# Patient Record
Sex: Female | Born: 1960 | Race: Black or African American | Hispanic: No | Marital: Single | Smoking: Never smoker
Health system: Southern US, Community
[De-identification: ages and names within clinical notes are randomized; demographics above are authoritative.]

## PROBLEM LIST (undated history)

## (undated) DIAGNOSIS — E079 Disorder of thyroid, unspecified: Secondary | ICD-10-CM

## (undated) DIAGNOSIS — E119 Type 2 diabetes mellitus without complications: Secondary | ICD-10-CM

## (undated) DIAGNOSIS — J40 Bronchitis, not specified as acute or chronic: Secondary | ICD-10-CM

## (undated) DIAGNOSIS — J349 Unspecified disorder of nose and nasal sinuses: Secondary | ICD-10-CM

## (undated) DIAGNOSIS — J45909 Unspecified asthma, uncomplicated: Secondary | ICD-10-CM

## (undated) DIAGNOSIS — I10 Essential (primary) hypertension: Secondary | ICD-10-CM

## (undated) HISTORY — PX: ABDOMINAL SURGERY: SHX537

## (undated) HISTORY — DX: Type 2 diabetes mellitus without complications: E11.9

## (undated) HISTORY — PX: ABDOMINAL HYSTERECTOMY: SHX81

## (undated) HISTORY — PX: HERNIA REPAIR: SHX51

---

## 2001-04-04 ENCOUNTER — Ambulatory Visit (HOSPITAL_COMMUNITY): Admission: RE | Admit: 2001-04-04 | Discharge: 2001-04-04 | Payer: Self-pay | Admitting: Family Medicine

## 2001-04-04 ENCOUNTER — Encounter: Payer: Self-pay | Admitting: Family Medicine

## 2002-10-12 ENCOUNTER — Emergency Department (HOSPITAL_COMMUNITY): Admission: EM | Admit: 2002-10-12 | Discharge: 2002-10-12 | Payer: Self-pay | Admitting: *Deleted

## 2003-12-20 ENCOUNTER — Emergency Department (HOSPITAL_COMMUNITY): Admission: EM | Admit: 2003-12-20 | Discharge: 2003-12-20 | Payer: Self-pay | Admitting: *Deleted

## 2003-12-23 ENCOUNTER — Emergency Department (HOSPITAL_COMMUNITY): Admission: EM | Admit: 2003-12-23 | Discharge: 2003-12-23 | Payer: Self-pay | Admitting: Emergency Medicine

## 2004-02-22 ENCOUNTER — Emergency Department (HOSPITAL_COMMUNITY): Admission: EM | Admit: 2004-02-22 | Discharge: 2004-02-22 | Payer: Self-pay | Admitting: Emergency Medicine

## 2004-04-06 ENCOUNTER — Emergency Department (HOSPITAL_COMMUNITY): Admission: EM | Admit: 2004-04-06 | Discharge: 2004-04-06 | Payer: Self-pay | Admitting: Emergency Medicine

## 2004-06-25 ENCOUNTER — Emergency Department (HOSPITAL_COMMUNITY): Admission: EM | Admit: 2004-06-25 | Discharge: 2004-06-25 | Payer: Self-pay | Admitting: Emergency Medicine

## 2006-01-31 ENCOUNTER — Encounter (HOSPITAL_COMMUNITY): Admission: RE | Admit: 2006-01-31 | Discharge: 2006-02-11 | Payer: Self-pay | Admitting: Preventative Medicine

## 2006-03-27 ENCOUNTER — Emergency Department (HOSPITAL_COMMUNITY): Admission: EM | Admit: 2006-03-27 | Discharge: 2006-03-27 | Payer: Self-pay | Admitting: Emergency Medicine

## 2006-06-27 ENCOUNTER — Encounter (HOSPITAL_COMMUNITY): Admission: RE | Admit: 2006-06-27 | Discharge: 2006-07-27 | Payer: Self-pay | Admitting: Orthopaedic Surgery

## 2006-11-06 ENCOUNTER — Emergency Department (HOSPITAL_COMMUNITY): Admission: EM | Admit: 2006-11-06 | Discharge: 2006-11-06 | Payer: Self-pay | Admitting: Emergency Medicine

## 2008-01-26 ENCOUNTER — Ambulatory Visit (HOSPITAL_COMMUNITY): Admission: RE | Admit: 2008-01-26 | Discharge: 2008-01-26 | Payer: Self-pay | Admitting: Family Medicine

## 2008-06-30 ENCOUNTER — Emergency Department (HOSPITAL_COMMUNITY): Admission: EM | Admit: 2008-06-30 | Discharge: 2008-06-30 | Payer: Self-pay | Admitting: Emergency Medicine

## 2008-07-22 ENCOUNTER — Inpatient Hospital Stay (HOSPITAL_COMMUNITY): Admission: EM | Admit: 2008-07-22 | Discharge: 2008-07-23 | Payer: Self-pay | Admitting: Emergency Medicine

## 2008-07-22 ENCOUNTER — Ambulatory Visit: Payer: Self-pay | Admitting: Internal Medicine

## 2008-07-23 ENCOUNTER — Ambulatory Visit: Payer: Self-pay | Admitting: Internal Medicine

## 2008-08-09 ENCOUNTER — Encounter: Payer: Self-pay | Admitting: Internal Medicine

## 2008-08-09 ENCOUNTER — Ambulatory Visit (HOSPITAL_COMMUNITY): Admission: RE | Admit: 2008-08-09 | Discharge: 2008-08-09 | Payer: Self-pay | Admitting: Obstetrics and Gynecology

## 2008-11-16 ENCOUNTER — Inpatient Hospital Stay (HOSPITAL_COMMUNITY): Admission: RE | Admit: 2008-11-16 | Discharge: 2008-11-18 | Payer: Self-pay | Admitting: Obstetrics and Gynecology

## 2008-11-16 ENCOUNTER — Encounter: Payer: Self-pay | Admitting: Obstetrics and Gynecology

## 2010-05-18 LAB — CBC
HCT: 38.8 % (ref 36.0–46.0)
Hemoglobin: 11.9 g/dL — ABNORMAL LOW (ref 12.0–15.0)
Platelets: 298 10*3/uL (ref 150–400)
RBC: 3.85 MIL/uL — ABNORMAL LOW (ref 3.87–5.11)
RBC: 4.32 MIL/uL (ref 3.87–5.11)
WBC: 4.5 10*3/uL (ref 4.0–10.5)
WBC: 7.9 10*3/uL (ref 4.0–10.5)

## 2010-05-18 LAB — COMPREHENSIVE METABOLIC PANEL
AST: 36 U/L (ref 0–37)
Albumin: 4 g/dL (ref 3.5–5.2)
Alkaline Phosphatase: 53 U/L (ref 39–117)
BUN: 12 mg/dL (ref 6–23)
CO2: 31 mEq/L (ref 19–32)
Chloride: 101 mEq/L (ref 96–112)
GFR calc Af Amer: >60 mL/min (ref >60)
GFR calc non Af Amer: 59 mL/min — ABNORMAL LOW (ref >60)
Potassium: 3 mEq/L — ABNORMAL LOW (ref 3.5–5.1)
Total Bilirubin: 0.5 mg/dL (ref 0.3–1.2)

## 2010-05-18 LAB — BASIC METABOLIC PANEL
Calcium: 7.9 mg/dL — ABNORMAL LOW (ref 8.4–10.5)
GFR calc Af Amer: >60 mL/min (ref >60)
GFR calc non Af Amer: >60 mL/min (ref >60)
Sodium: 135 mEq/L (ref 135–145)

## 2010-05-18 LAB — DIFFERENTIAL
Basophils Absolute: 0 10*3/uL (ref 0.0–0.1)
Lymphocytes Relative: 12 % (ref 12–46)
Lymphs Abs: 0.9 10*3/uL (ref 0.7–4.0)
Monocytes Absolute: 0.4 10*3/uL (ref 0.1–1.0)
Monocytes Relative: 5 % (ref 3–12)
Neutro Abs: 6.5 10*3/uL (ref 1.7–7.7)

## 2010-05-18 LAB — POCT I-STAT 4, (NA,K, GLUC, HGB,HCT)
Glucose, Bld: 106 mg/dL — ABNORMAL HIGH (ref 70–99)
HCT: 47 % — ABNORMAL HIGH (ref 36.0–46.0)

## 2010-05-22 LAB — COMPREHENSIVE METABOLIC PANEL
ALT: 17 U/L (ref 0–35)
AST: 20 U/L (ref 0–37)
CO2: 31 mEq/L (ref 19–32)
Chloride: 100 mEq/L (ref 96–112)
Creatinine, Ser: 0.83 mg/dL (ref 0.4–1.2)
GFR calc Af Amer: >60 mL/min (ref >60)
GFR calc non Af Amer: >60 mL/min (ref >60)
Sodium: 138 mEq/L (ref 135–145)
Total Bilirubin: 0.3 mg/dL (ref 0.3–1.2)

## 2010-05-22 LAB — URINALYSIS, ROUTINE W REFLEX MICROSCOPIC
Bilirubin Urine: NEGATIVE
Nitrite: NEGATIVE
Specific Gravity, Urine: 1.03 (ref 1.005–1.030)
pH: 6 (ref 5.0–8.0)

## 2010-05-22 LAB — DIFFERENTIAL
Basophils Absolute: 0 10*3/uL (ref 0.0–0.1)
Basophils Absolute: 0 10*3/uL (ref 0.0–0.1)
Basophils Relative: 0 % (ref 0–1)
Basophils Relative: 0 % (ref 0–1)
Eosinophils Absolute: 0.1 10*3/uL (ref 0.0–0.7)
Eosinophils Relative: 2 % (ref 0–5)
Eosinophils Relative: 2 % (ref 0–5)
Monocytes Absolute: 0.3 10*3/uL (ref 0.1–1.0)

## 2010-05-22 LAB — HEMOGLOBIN AND HEMATOCRIT, BLOOD: Hemoglobin: 8.4 g/dL — ABNORMAL LOW (ref 12.0–15.0)

## 2010-05-22 LAB — BASIC METABOLIC PANEL
BUN: 9 mg/dL (ref 6–23)
Calcium: 8.3 mg/dL — ABNORMAL LOW (ref 8.4–10.5)
GFR calc non Af Amer: >60 mL/min (ref >60)
Glucose, Bld: 113 mg/dL — ABNORMAL HIGH (ref 70–99)

## 2010-05-22 LAB — CBC
HCT: 24.6 % — ABNORMAL LOW (ref 36.0–46.0)
MCHC: 32.8 g/dL (ref 30.0–36.0)
MCV: 78.6 fL (ref 78.0–100.0)
Platelets: 332 10*3/uL (ref 150–400)
RBC: 3.47 MIL/uL — ABNORMAL LOW (ref 3.87–5.11)
RDW: 16 % — ABNORMAL HIGH (ref 11.5–15.5)
WBC: 7 10*3/uL (ref 4.0–10.5)

## 2010-05-22 LAB — CROSSMATCH

## 2010-05-22 LAB — LIPASE, BLOOD: Lipase: 19 U/L (ref 11–59)

## 2010-06-27 NOTE — H&P (Signed)
Hannah Potter, Hannah Potter             ACCOUNT NO.:  [id number]   MEDICAL RECORD NO.:  [id number]          PATIENT TYPE:  INP   LOCATION:  A309                          FACILITY:  [facility]   PHYSICIAN:  Angus G. Renard Matter, MD   DATE OF BIRTH:  [birth year]   DATE OF ADMISSION:  07/22/2008  DATE OF DISCHARGE:  LH                              HISTORY & PHYSICAL   A 50 year old African American female, patient of Dr. Sudie Bailey,  presented to the emergency department with chief complaint being lower  abdominal pain, intermittent vaginal bleeding for the past month and  some occasional rectal bleeding.  The patient today stated she had some  nausea and weakness, tremor of the upper extremities, complaint of lower  abdominal pain which was sharp, intermittent, moderately severe.  The  patient had no diarrhea.  The patient was examined by ED physician and  was noted that she had some vaginal bleeding and rectal exam was  positive for blood.  The patient had subsequently a CT of the abdomen  which showed no acute or significant abnormalities.  Umbilical hernia  was noted containing fat, numerous uterine fibroids present.  No adnexal  masses.  The patient was subsequently admitted, was noted to have a low  hemoglobin as well.  CBC showed a hemoglobin 8.9, hematocrit 27.3,  potassium low at 3.2.  Urine drug screen essentially negative.   SOCIAL HISTORY:  The patient does not smoke, drink alcohol, or use  drugs.   PAST SURGICAL HISTORY:  The patient has prior history of knee surgery.   PAST MEDICAL HISTORY:  1. Asthma.  2. Hypertension.  3. Hypothyroidism.   ALLERGIES:  No known allergies.   REVIEW OF SYSTEMS:  HEENT:  Negative.  CARDIOPULMONARY:  No cough,  hemoptysis, or dyspnea.  GI:  Bouts of nausea.  No diarrhea.  Lower  abdominal pain.  GYN:  The patient has had history of prolonged menses  in the fall and again she bled throughout the month of April.   PHYSICAL EXAMINATION:  GENERAL:  Obese,  alert female.  VITAL SIGNS:  Blood pressure __________, pulse __________, respirations  __________ and temperature __________.  HEENT:  Eyes, PERRLA.  TMs negative.  Oropharynx benign.  NECK:  Supple.  No JVD or thyroid abnormalities.  HEART:  Regular rhythm.  No murmurs.  LUNGS:  Clear to P and A.  ABDOMEN:  No palpable organs or masses.  No organomegaly.  EXTREMITIES:  Free of edema.  GENITOURINARY:  Menstrual bleeding noted by ED physician.  Stool was  positive for blood noted by ED physician.   ASSESSMENT:  The patient admitted with abdominal pain, anemia, rectal  and vaginal bleeding, hypokalemia.   PLAN:  To continue to monitor vital signs.  Continue to monitor  hemoglobin/hematocrit or transfuse as needed.  We will replace potassium  and have GYN and possible GI consult.      Angus G. Renard Matter, MD  Electronically Signed     AGM/MEDQ  D:  07/22/2008  T:  07/23/2008  Job:  [id number]

## 2010-06-27 NOTE — Consult Note (Signed)
NAMERONNY, RUDDELL             ACCOUNT NO.:  [id number]   MEDICAL RECORD NO.:  [id number]          PATIENT TYPE:  INP   LOCATION:  A309                          FACILITY:  [facility]   PHYSICIAN:  R. Roetta Sessions, M.D. DATE OF BIRTH:  31-Dec-1960   DATE OF CONSULTATION:  07/22/2008  DATE OF DISCHARGE:                                 CONSULTATION   REASON FOR CONSULTATION:  Hemoccult positive stool, profound microcytic  anemia.   HISTORY OF PRESENT ILLNESS:  Hannah Potter is a pleasant 50-  year-old [place] American female from [place] who came to emergency  room today from work when she developed diffuse abdominal pain and some  diaphoresis.  She presented to emergency department where she was  evaluated and found to be profoundly anemic with a hemoglobin and  hematocrit 8.9 and 27.3, MCV 78.6, platelet count was 313,000.  Ms.  Hannah Potter underwent a battery of studies in the emergency department  including a comprehensive metabolic profile which revealed a potassium  of 3.2, otherwise looked good.  White count was 7000.  She was found to  be occult blood positive on digital rectal exam in emergency room.  She  denies melena, hematochezia.  She does note heavy menstrual flow which  has been ongoing now for 6 weeks.  CT scan of the abdomen and pelvis on  admission demonstrated uterine fibroids and a small umbilical hernia.  Otherwise there was no evidence of colitis or other abnormality to  explain abdominal pain.  She received some Dilaudid and her abdominal  pain has since resolved.  Also noted normal serum lipase on admission.   [name] tells me she has never had abdominal pain like this before  and she denies constipation, diarrhea, melena or hematochezia.  She  denies any upper GI tract symptoms such odynophagia, dysphagia, early  sign of reflux symptoms or nausea or vomiting.   Positive family history is in her father who she believes had a colonic  resection for  colon cancer in his 50s and he had close surveillance for  several years as she describes he is alive and doing well in his 50s  now.  Otherwise there is no history of chronic GI or liver illness.   PAST MEDICAL HISTORY:  Impaired glucose tolerance, hypertension,  hypothyroidism.   MEDICATIONS ON ADMISSION:  1. Synthroid 150 mcg daily.  2. Hydrochlorothiazide 25 mg daily.  3. Albuterol and Flovent p.r.n.   ALLERGIES:  NO KNOWN DRUG ALLERGIES   FAMILY HISTORY:  As outlined above.   SOCIAL HISTORY:  The patient is single.  She has two children in good  health.  She does not use tobacco, alcohol or illicit drugs.  She works  for [employer], she has worked there for 15 years.   REVIEW OF SYSTEMS:  As in history of present illness.  No fever, chills,  night sweats.  No change in weight recently.   PHYSICAL EXAMINATION:  GENERAL:  A very pleasant lady resting  comfortably accompanied by a friend in room 309.  VITAL SIGNS:  Temperature 97.6, pulse 64, BP 109/90, O2  saturation 97%  on room air.  SKIN:  Warm and dry.  HEENT: Exam no scleral icterus.  Conjunctivae pink.  Somewhat pale.  CHEST:  Lungs are clear to auscultation.  HEART:  Regular rate and rhythm without murmur, gallop or rub.  ABDOMEN:  Obese, positive bowel sounds, soft, entirely nontender without  appreciable mass or organomegaly.  EXTREMITIES:  No edema.  RECTAL:  Exam deferred until time of colonoscopy.   LABORATORY DATA:  H and H at 1950 is 8.4 and 25.9.  Sodium 138,  potassium 3.2, chloride 100, CO2 31, glucose 153, BUN 10, creatinine  0.8.  Total bilirubin 0.3, alkaline phos 57, AST 20, ALT 79, BUN 3.5.   IMPRESSION:  Hannah Potter is a very pleasant obese 50-year lady  admitted to hospital with profound anemia and Hemoccult positive stool.  However, she has had her menstrual flow now she reports for six weeks.   Diffuse crampy abdominal pain prompted her ED visit today.  That symptom  was apparently  self limiting and abolished with a single dose of  Dilaudid.  It is reassuring to note her labs above and essentially  negative CT findings aside from uterine fibroids.   Hemoccult positive stool may be a true positive or could be a spurious  positive with contamination from menstrual bleeding.  I suspect that if  she has had a menstrual flow for 6 weeks this may be more of a cause of  her low hemoglobin rather than anything else.  However, she has been  documented to be Hemoccult positive and appears to have a positive  female history of colon cancer in first-degree relative at a young age.   Etiology of her abdominal pain which was self-limiting is not clear to  me, however is reassuring or negative laboratory workup as well as  essentially negative CT findings.  She really does not have any chronic  GI symptoms and clinically she has not had any bleeding.   RECOMMENDATIONS:  1. While she is here I feel it would be in her best interest to go      ahead and perform a diagnostic colonoscopy in the near future.      Risks, benefits, alternatives and limitations have been reviewed.      Her questions were answered.  I told Hannah Potter if colonoscopy was      negative would go ahead and do an EGD complete her inpatient GI      evaluation.  She was also agreeable with this approach as well.  I      will go ahead and give her prophylactic acid suppression therapy      for the time being.  2. I fully agree with in a GYN evaluation.  3. Will go ahead and treat her hypokalemia with oral therapy.   I discussed findings and recommendations with Dr. Butch Penny at the  nurses' station this evening.   I would like to thank Dr. Renard Matter for allowing me to see this nice lady  today in consultation.  Will plan endoscopic evaluation for July 23, 2008.      Hannah Potter, M.D.  Electronically Signed     RMR/MEDQ  D:  07/22/2008  T:  07/23/2008  Job:  [id number]   cc:   Dr. Gareth Morgan

## 2010-06-27 NOTE — Op Note (Signed)
Hannah Potter, Hannah Potter             ACCOUNT NO.:  [id number]   MEDICAL RECORD NO.:  [id number]          PATIENT TYPE:  INP   LOCATION:  A309                          FACILITY:  [facility]   PHYSICIAN:  R. Roetta Sessions, M.D. DATE OF BIRTH:  December 11, 1960   DATE OF PROCEDURE:  07/23/2008  DATE OF DISCHARGE:                               OPERATIVE REPORT   PROCEDURES PERFORMED:  Diagnostic colonoscopy, followed by  esophagogastroduodenoscopy.   INDICATION FOR PROCEDURE:  A 50 year old lady admitted to the hospital  with profound microcytic anemia and Hemoccult-positive stool.  She has  had heavy menstrual flow for the past 6 weeks.  Positive family history  of colon cancer in her father, who underwent a resection in his 30s.  Colonoscopy is now being done to further evaluate her symptoms.  EGD to  follow if colonoscopy negative.  This approach has been discussed with  the patient at length yesterday and again at the bedside.   PROCEDURE NOTE:  O2 saturation, blood pressure, pulse, and respiration  were monitored throughout the entire procedure.  Conscious sedation:  Versed 4 mg IV, Demerol 75 mg IV in divided doses.  Instrument:  Pentax  video chip system.   Findings:  Digital rectal exam revealed no abnormalities.  Endoscopic  findings:  Prep was good.  Colon:  Colonic mucosa was surveyed from the  rectosigmoid junction through the left, transverse, right colon, to the  appendiceal orifice, ileocecal valve and cecum.  These structures were  well-seen and photographed for the record.  From this level the scope  was slowly withdrawn and all previously-mentioned mucosal surfaces were  again seen.  The patient had a long, redundant colon.  Otherwise, the  mucosa appeared normal.  The scope was pulled down in the rectum, where  a thorough examination of the rectal mucosa including retroflexed view  of the anal verge demonstrated no abnormalities.  Cecal withdrawal time  8 minutes.   The patient  was prepared for EGD with Cetacaine spray for topical  oropharyngeal anesthesia.  Findings:  Examination of the tubular  esophagus revealed no mucosal abnormalities, EG junction easily  traversed.  Stomach:  The gastric cavity was emptied and insufflated  well with air.  A thorough examination of the gastric mucosa including  retroflexion in the proximal stomach-esophagogastric junction  demonstrated only a small hiatal hernia.  The pylorus was patent and  easily traversed.  Examination of the bulb and second portion revealed  no abnormalities.   Therapeutic/diagnostic maneuvers performed:  None.   The patient tolerated both procedures well, was reacted in endoscopy.   IMPRESSION:  1. Colonoscopy:  Normal rectum.  A long, redundant, otherwise normal-      appearing colonic mucosa.  2. Normal esophagus.  Small hiatal hernia.  Otherwise normal stomach,      D1, D2.   I suspect Hemoccult-positive stool was spuriously positive in the  setting of menorrhagia.  I do not advocate further GI evaluation at this  time.  I certainly agree with a gynecological evaluation as has been  orchestrated by Dr. Sudie Bailey.  Given the patient's family history  of  colon cancer, recommend she return in 5 years for high-risk screening  colonoscopy.  Hemoglobin is stable at 8.1.  Potassium is up at 4.3.  We  will advance her diet.  From a GI standpoint, she could go home anytime.  I have discussed my findings and recommendations with Dr. Sudie Bailey via  telephone today.   I would like to thank Dr. Gareth Morgan for allowing me to see this  nice lady.      Jonathon Bellows, M.D.  Electronically Signed     RMR/MEDQ  D:  07/23/2008  T:  07/23/2008  Job:  [id number]   cc:   Mila Homer. Sudie Bailey, M.D.  Fax: [phone]   Lazaro Arms, M.D.  Fax: [phone]

## 2010-06-27 NOTE — Discharge Summary (Signed)
Hannah Potter, Hannah Potter             ACCOUNT NO.:  [id number]   MEDICAL RECORD NO.:  [id number]          PATIENT TYPE:  INP   LOCATION:  A309                          FACILITY:  [facility]   PHYSICIAN:  Mila Homer. Sudie Bailey, M.D.DATE OF BIRTH:  [birth year]   DATE OF ADMISSION:  07/22/2008  DATE OF DISCHARGE:  06/11/2010LH                               DISCHARGE SUMMARY   This 50 year old presented to the emergency room yesterday, having had  abdominal pain and weakness.  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   She had an episode of vaginal bleeding last winter which cleared with  p.o. meds in the office and then she went for several months without  further bleeding, but then started up again in March and bled until May,  but did not contact me in the office until the week that I was out of  the office.  She then did nothing until yesterday at which time she  presented to the emergency room.   The morning after admission, she was supine in bed, awake, alert,  oriented, no acute distress, and the pain which she had been having,  which is near the left lower quadrant, had cleared.  Temperature is 97  degrees, pulse 72, respiratory rate 18, blood pressure 129/69.  She did  have some tenderness in the right upper quadrant to deep palpation.  The  abdomen otherwise was nontender and obese.  Heart had a regular rhythm  without murmur, rate of 70.  Lungs were clear throughout.   Her admission hemoglobin was 8.9 and that dropped to 8.4 last night, 8.1  this morning with hydration.  Bicarb was 35, glucose 113, white cell  count 5300.   Her abdominal pelvic CT was essentially normal except for fibroid in the  uterus.  The largest of these numerous fibroids, large of  these which  was 4.7 x 4.3 cm.  One of the fibroids is noted to be calcified and  degenerated.   I discussed her case with Dr. Augusto Gamble.  Since she had Hemoccult  positive stool when she was seen in the ER and also because her dad had  colon cancer in his 61s, she will get a colonoscopy this morning  followed by an EGD if that is negative.  Also discussed her case  personally with Dr. Duane Lope, gynecologist.  Assuming the GI system  as negative, we will give her a shot of Depoprovera to control the  bleeding, and we will see her in the office next week with further  evaluation and consideration of various modalities of treatment  including the surgery at that time.   Final discharge diagnoses are:  1. Abdominal pain, probably from a degenerated fibroid.  2. Anemia probably from the bleeding from her uterine fibroids, which  has gone on now 4 weeks.  3. Uterine fibroids.  4. Obesity.  5. Hemoccult positive stool.  6. Family history for colon cancer (dad in his mid 49s).   I discussed possibly blood transfusion with her and she is really quite  against this.  I have also discussed with Dr. Jena Gauss and since this  anemia has come on slowly and she is tolerating it enough to go to work,  the plan would be to put her on iron sulfate 325 b.i.d. and 2  multivitamins a day, get the bleeding under control, and then see if we  can get her hemoglobin to go up.  She may require surgery in the future.  She may be able to have some autologous blood transfusions if she can  get her hemoglobin up.  To follow up in my office next week to recheck  her hemoglobin.       Mila Homer. Sudie Bailey, M.D.  Electronically Signed     SDK/MEDQ  D:  07/23/2008  T:  07/23/2008  Job:  [id number]

## 2010-07-31 ENCOUNTER — Encounter (HOSPITAL_COMMUNITY)
Admission: RE | Admit: 2010-07-31 | Discharge: 2010-07-31 | Disposition: A | Discharge status: Home / Self Care | Payer: 59 | Source: Ambulatory Visit | Attending: General Surgery | Admitting: General Surgery

## 2010-07-31 LAB — SURGICAL PCR SCREEN: Staphylococcus aureus: NEGATIVE

## 2010-07-31 LAB — BASIC METABOLIC PANEL
BUN: 18 mg/dL (ref 6–23)
GFR calc Af Amer: >60 mL/min (ref >60)
GFR calc non Af Amer: >60 mL/min (ref >60)
Potassium: 3.9 mEq/L (ref 3.5–5.1)
Sodium: 137 mEq/L (ref 135–145)

## 2010-07-31 LAB — CBC
MCHC: 32 g/dL (ref 30.0–36.0)
Platelets: 274 10*3/uL (ref 150–400)
RDW: 14.1 % (ref 11.5–15.5)

## 2010-08-04 ENCOUNTER — Observation Stay (HOSPITAL_COMMUNITY)
Admission: RE | Admit: 2010-08-04 | Discharge: 2010-08-05 | Disposition: A | Discharge status: Home / Self Care | Payer: 59 | Source: Ambulatory Visit | Attending: General Surgery | Admitting: General Surgery

## 2010-08-04 DIAGNOSIS — Z23 Encounter for immunization: Secondary | ICD-10-CM | POA: Insufficient documentation

## 2010-08-04 DIAGNOSIS — Z01812 Encounter for preprocedural laboratory examination: Secondary | ICD-10-CM | POA: Insufficient documentation

## 2010-08-04 DIAGNOSIS — K432 Incisional hernia without obstruction or gangrene: Principal | ICD-10-CM | POA: Insufficient documentation

## 2010-08-04 DIAGNOSIS — Z01810 Encounter for preprocedural cardiovascular examination: Secondary | ICD-10-CM | POA: Insufficient documentation

## 2010-08-04 DIAGNOSIS — I10 Essential (primary) hypertension: Secondary | ICD-10-CM | POA: Insufficient documentation

## 2010-08-30 NOTE — Discharge Summary (Addendum)
Hannah Potter, Hannah Potter NO.:  [id number]  MEDICAL RECORD NO.:  [id number]  LOCATION:  A338                          FACILITY:  [facility]  PHYSICIAN:  Tilford Pillar, MD      DATE OF BIRTH:  10-Sep-1960  DATE OF ADMISSION:  08/04/2010 DATE OF DISCHARGE:  06/23/2012LH                              DISCHARGE SUMMARY   ADMISSION DIAGNOSIS:  Ventral incisional hernia.  DISCHARGE DIAGNOSES: 1. History of asthma. 2. History of hypertension. 3. History of hypothyroidism.  DISPOSITION:  Home.  BRIEF HISTORY AND PHYSICAL:  Please see the admission history and physical for complete H and P.  The patient is a 50 year old female who presented to my office with a history of a bulge in her lower abdomen adjacent to a midline incisional scar.  Workup was consistent for an incisional hernia.  Risks, benefits, and alternatives of repair were discussed at length with the patient.  She was admitted for planned intervention and management.  HOSPITAL COURSE:  The patient was admitted on August 04, 2010.  She was taken to the operating room for a laparoscopic ventral hernia repair, at which point she was returned to the postanesthetic care unit in stable condition. Due to some discomfort following the procedure and for pain management, she was admitted for continued modification of her pain regimen.  On August 05, 2010, the patient was tolerating oral pain management.  She was ambulatory.  She is tolerating regular diet and plans were made for discharge to home.  DISCHARGE INSTRUCTIONS:  She is instructed to increase activity as tolerated.  She is not to lift anything greater than 20 pounds for the next 4 weeks.  She may resume normal diet.  She may shower, but she is not to soak the incisions for the next 3 weeks.  She is to follow up in my office in 3 weeks.  She is to call should she have any questions or concerns.  DISCHARGE MEDICATIONS:  Please see the discharge  medication reconciliation sheet for all discharge medications.     Tilford Pillar, MD     BZ/MEDQ  D:  08/30/2010  T:  08/30/2010  Job:  [id number]

## 2010-08-30 NOTE — Op Note (Signed)
Hannah Potter, OYOLA NO.:  [id number]  MEDICAL RECORD NO.:  [id number]  LOCATION:                                 FACILITY:  PHYSICIAN:  Tilford Pillar, MD      DATE OF BIRTH:  12-Mar-1960  DATE OF PROCEDURE:  08/04/2010 DATE OF DISCHARGE:                              OPERATIVE REPORT   PREOPERATIVE DIAGNOSIS:  Incisional hernia.  POSTOPERATIVE DIAGNOSIS:  Incisional hernia.  PROCEDURE:  Laparoscopic incisional hernia repair with 20 x 25 cm PHYSIOMESH.  SURGEON:  Tilford Pillar, MD.  ANESTHESIA:  General endotracheal.  Local anesthetic with 0.5% Sensorcaine plain.  SPECIMEN:  None.  ESTIMATED BLOOD LOSS:  Minimal.  INDICATIONS:  The patient is a 50 year old female with a history of a midline incisional scar who presented to my office with a bulge just left inferior to her umbilicus.  This has increased in size and discomfort over the last several months.  Evaluation was consistent for a large incision hernia.  Risks, benefits, and alternatives were discussed with the patient.  Plans were made for __________operating room with a laparoscopic hernia repair.  She understood the risks including but not limited to the risk of bleeding, infection, infection of the mesh requiring removal, current possibility of postoperative __________ as well as intraoperative cardiac and pulmonary events. __________were discussed at length __________ both laparoscopic and open procedures and she does understand that the laparoscopic approach definitely does result in more of additional postoperative pain.  She is aware that she __________ possible monitoring and management of her pain versus several postoperative days.  With this, she consented the planned procedure and does wish to proceed with laparoscopic __________.  PROCEDURE:  The patient was taken to the operating room and was placed in supine position on the operating room table at which time a general anesthetic  was administered.  Once the patient was sedated, she was endotracheally intubated by Anesthesia.  At this point, a Foley catheter was placed in a standard sterile fashion by the operating room staff. Abdomen was prepped with DuraPrep solution and draped in a standard fashion.  A stab incision was created in the left subcostal margin at [place] point.  A Veress needle was inserted.  Saline drop test was utilized to confirm intraperitoneal placement and then pneumoperitoneum was initiated.  Once sufficient pneumoperitoneum was obtained, an 11-mm trocar was inserted in the left lateral abdominal wall.  With a stab incision, the trocar was placed with the laparoscope __________ abdominal cavity.  At this time, the inner cannula was removed.  The laparoscope was reinserted and there was no evidence of any trocar or Veress needle placement injury.  The Veress needle was removed at this point.  __________ the lens was changed to a 30-degree lens.  At this point, I placed a 5-mm trocar in the left lower quadrant.  The hernia was clearly identified.  Omental adhesions were noted in the lower abdomen, and these were taken down with a combination of blunt and sharp __________ dissection as well as __________division.  At this point, I was able to visualize the herniated defect sizeable and at this point, I did __________.  I did use the  palpation of the finger on the anterior abdominal wall to mark the planned pexing suture site.  The pneumoperitoneum was evacuated for more accurate measurement of the __________ the size of approximately 19 x 22 __________ with the size of 19 x 22 adequately covering the site __________Proceed mesh into the field.  Pexing sutures were placed in the 4 quadrants with 2-0 Novafil sutures, mesh was then rolled, and it was inserted into the abdominal cavity.  Additionally, at this point I also placed an additional 11 mm trocar in the left upper quadrant as well as a 5-mm  trocar in the right lower quadrant.  At this point, the __________ and Endoclose suture passing device was utilized __________.  I did start laterally on the right side to ensure that mesh was positioned adequately due to the large size, positioning of the trocar site, I did not actually have to do the 5-mm trocar more anteriorly on the left side to avoid it being covered by mesh during the repair.  With the pexing sutures in place, I then circumferentially pexed the mesh with a Vicryl stapler through the anterior abdominal wall.  I put the mesh circumferentially and __________ to the abdominal wall, I did inspect the __________ mesh and the coverage over the hernia defect.  At this point, I turned my attention to __________ Vicryl sutures were utilized and closed the 11- mm trocar sites, and pneumoperitoneum was evacuated.  The trocars were removed, the Vicryl sutures were __________ local anesthetic was instilled.  4-0 Monocryl was utilized to reapproximate the skin __________ trocar sites, and the skin was washed and dried with moist and dry towel.  Benzoin was placed around all stab incision and trocar sites and half-inch Steri-Strips were placed __________.  The patient tolerated the procedure well and was allowed to come out of the general anesthetic and was transferred back to regular hospital bed and was transferred to the postanesthetic care unit in stable condition.  At the conclusion of the procedure, all instruments, sponge, and needle counts were correct.  The patient tolerated the procedure extremely well.     Tilford Pillar, MD     BZ/MEDQ  D:  08/07/2010  T:  08/08/2010  Job:  [id number]  Electronically Signed by Tilford Pillar MD on 08/30/2010 08:27:49 AM

## 2011-01-27 ENCOUNTER — Encounter: Payer: Self-pay | Admitting: Emergency Medicine

## 2011-01-27 ENCOUNTER — Emergency Department (HOSPITAL_COMMUNITY)
Admission: EM | Admit: 2011-01-27 | Discharge: 2011-01-27 | Disposition: A | Discharge status: Home / Self Care | Payer: 59 | Attending: Emergency Medicine | Admitting: Emergency Medicine

## 2011-01-27 ENCOUNTER — Emergency Department (HOSPITAL_COMMUNITY): Payer: 59

## 2011-01-27 DIAGNOSIS — R197 Diarrhea, unspecified: Secondary | ICD-10-CM | POA: Insufficient documentation

## 2011-01-27 DIAGNOSIS — R0989 Other specified symptoms and signs involving the circulatory and respiratory systems: Secondary | ICD-10-CM | POA: Insufficient documentation

## 2011-01-27 DIAGNOSIS — R0602 Shortness of breath: Secondary | ICD-10-CM | POA: Insufficient documentation

## 2011-01-27 DIAGNOSIS — I10 Essential (primary) hypertension: Secondary | ICD-10-CM | POA: Insufficient documentation

## 2011-01-27 DIAGNOSIS — J3489 Other specified disorders of nose and nasal sinuses: Secondary | ICD-10-CM | POA: Insufficient documentation

## 2011-01-27 DIAGNOSIS — R059 Cough, unspecified: Secondary | ICD-10-CM | POA: Insufficient documentation

## 2011-01-27 DIAGNOSIS — R0609 Other forms of dyspnea: Secondary | ICD-10-CM | POA: Insufficient documentation

## 2011-01-27 DIAGNOSIS — R05 Cough: Secondary | ICD-10-CM | POA: Insufficient documentation

## 2011-01-27 DIAGNOSIS — J45909 Unspecified asthma, uncomplicated: Secondary | ICD-10-CM | POA: Insufficient documentation

## 2011-01-27 DIAGNOSIS — J111 Influenza due to unidentified influenza virus with other respiratory manifestations: Secondary | ICD-10-CM

## 2011-01-27 HISTORY — DX: Essential (primary) hypertension: I10

## 2011-01-27 HISTORY — DX: Disorder of thyroid, unspecified: E07.9

## 2011-01-27 MED ORDER — ALBUTEROL SULFATE (5 MG/ML) 0.5% IN NEBU
2.5000 mg | INHALATION_SOLUTION | Freq: Once | RESPIRATORY_TRACT | Status: AC
  Administered 2011-01-27: 2.5 mg via RESPIRATORY_TRACT
  Filled 2011-01-27: qty 0.5

## 2011-01-27 MED ORDER — PREDNISONE 10 MG PO TABS: 20.0000 mg | ORAL_TABLET | Freq: Every day | ORAL | Status: DC

## 2011-01-27 MED ORDER — PREDNISONE 20 MG PO TABS
60.0000 mg | ORAL_TABLET | Freq: Once | ORAL | Status: AC
  Administered 2011-01-27: 60 mg via ORAL
  Filled 2011-01-27: qty 3

## 2011-01-27 MED ORDER — IPRATROPIUM BROMIDE 0.02 % IN SOLN
0.5000 mg | Freq: Once | RESPIRATORY_TRACT | Status: AC
  Administered 2011-01-27: 0.5 mg via RESPIRATORY_TRACT
  Filled 2011-01-27: qty 2.5

## 2011-01-27 NOTE — ED Notes (Signed)
Pt c/o coughing and sob since Tuesday.

## 2011-01-27 NOTE — Discharge Instructions (Signed)
Please use your inhaler every four hours until improved.  Return if worse.  Recheck with your doctor on Monday.     Asthma, Adult Asthma is caused by narrowing of the air passages in the lungs. It may be triggered by pollen, dust, animal dander, molds, some foods, respiratory infections, exposure to smoke, exercise, emotional stress or other allergens (things that cause allergic reactions or allergies). Repeat attacks are common. HOME CARE INSTRUCTIONS   Use prescription medications as ordered by your caregiver.   Avoid pollen, dust, animal dander, molds, smoke and other things that cause attacks at home and at work.   You may have fewer attacks if you decrease dust in your home. Electrostatic air cleaners may help.   It may help to replace your pillows or mattress with materials less likely to cause allergies.   Talk to your caregiver about an action plan for managing asthma attacks at home, including, the use of a peak flow meter which measures the severity of your asthma attack. An action plan can help minimize or stop the attack without having to seek medical care.   If you are not on a fluid restriction, drink 8 to 10 glasses of water each day.   Always have a plan prepared for seeking medical attention, including, calling your physician, accessing local emergency care, and calling 911 (in the [place] for a severe attack.   Discuss possible exercise routines with your caregiver.   If animal dander is the cause of asthma, you may need to get rid of pets.  SEEK MEDICAL CARE IF:   You have wheezing and shortness of breath even if taking medicine to prevent attacks.   You have muscle aches, chest pain or thickening of sputum.   Your sputum changes from clear or white to yellow, green, gray, or bloody.   You have any problems that may be related to the medicine you are taking (such as a rash, itching, swelling or trouble breathing).  SEEK IMMEDIATE MEDICAL CARE IF:   Your usual  medicines do not stop your wheezing or there is increased coughing and/or shortness of breath.   You have increased difficulty breathing.   You have a fever.  MAKE SURE YOU:   Understand these instructions.   Will watch your condition.   Will get help right away if you are not doing well or get worse.  Document Released: 01/29/2005 Document Revised: 10/11/2010 Document Reviewed: 09/17/2007 [facility] Patient Information 2012 [place], [place].Influenza Facts Flu (influenza) is a contagious respiratory illness caused by the influenza viruses. It can cause mild to severe illness. While most healthy people recover from the flu without specific treatment and without complications, older people, young children, and people with certain health conditions are at higher risk for serious complications from the flu, including death. CAUSES   The flu virus is spread from person to person by respiratory droplets from coughing and sneezing.   A person can also become infected by touching an object or surface with a virus on it and then touching their mouth, eye or nose.   Adults may be able to infect others from 1 day before symptoms occur and up to 7 days after getting sick. So it is possible to give someone the flu even before you know you are sick and continue to infect others while you are sick.  SYMPTOMS   Fever (usually high).   Headache.   Tiredness (can be extreme).   Cough.   Sore throat.   Runny  or stuffy nose.   Body aches.   Diarrhea and vomiting may also occur, particularly in children.   These symptoms are referred to as "flu-like symptoms". A lot of different illnesses, including the common cold, can have similar symptoms.  DIAGNOSIS   There are tests that can determine if you have the flu as long you are tested within the first 2 or 3 days of illness.   A doctor's exam and additional tests may be needed to identify if you have a disease that is a complicating the flu.    RISKS AND COMPLICATIONS  Some of the complications caused by the flu include:  Bacterial pneumonia or progressive pneumonia caused by the flu virus.   Loss of body fluids (dehydration).   Worsening of chronic medical conditions, such as heart failure, asthma, or diabetes.   Sinus problems and ear infections.  HOME CARE INSTRUCTIONS   Seek medical care early on.   If you are at high risk from complications of the flu, consult your health-care provider as soon as you develop flu-like symptoms. Those at high risk for complications include:   People 65 years or older.   People with chronic medical conditions, including diabetes.   Pregnant women.   Young children.   Your caregiver may recommend use of an antiviral medication to help treat the flu.   If you get the flu, get plenty of rest, drink a lot of liquids, and avoid using alcohol and tobacco.   You can take over-the-counter medications to relieve the symptoms of the flu if your caregiver approves. (Never give aspirin to children or teenagers who have flu-like symptoms, particularly fever).  PREVENTION  The single best way to prevent the flu is to get a flu vaccine each fall. Other measures that can help protect against the flu are:  Antiviral Medications   A number of antiviral drugs are approved for use in preventing the flu. These are prescription medications, and a doctor should be consulted before they are used.   Habits for Good Health   Cover your nose and mouth with a tissue when you cough or sneeze, throw the tissue away after you use it.   Wash your hands often with soap and water, especially after you cough or sneeze. If you are not near water, use an alcohol-based hand cleaner.   Avoid people who are sick.   If you get the flu, stay home from work or school. Avoid contact with other people so that you do not make them sick, too.   Try not to touch your eyes, nose, or mouth as germs ore often spread this  way.  IN CHILDREN, EMERGENCY WARNING SIGNS THAT NEED URGENT MEDICAL ATTENTION:  Fast breathing or trouble breathing.   Bluish skin color.   Not drinking enough fluids.   Not waking up or not interacting.   Being so irritable that the child does not want to be held.   Flu-like symptoms improve but then return with fever and worse cough.   Fever with a rash.  IN ADULTS, EMERGENCY WARNING SIGNS THAT NEED URGENT MEDICAL ATTENTION:  Difficulty breathing or shortness of breath.   Pain or pressure in the chest or abdomen.   Sudden dizziness.   Confusion.   Severe or persistent vomiting.  SEEK IMMEDIATE MEDICAL CARE IF:  You or someone you know is experiencing any of the symptoms above. When you arrive at the emergency center,report that you think you have the flu. You may be  asked to wear a mask and/or sit in a secluded area to protect others from getting sick. MAKE SURE YOU:   Understand these instructions.   Monitor your condition.   Seek medical care if you are getting worse, or not improving.  Document Released: 02/01/2003 Document Revised: 10/11/2010 Document Reviewed: 10/28/2008 [facility] Patient Information 2012 [place], [place].

## 2011-01-27 NOTE — ED Provider Notes (Signed)
History     CSN: [id number] Arrival date & time: 01/27/2011  5:43 PM   First MD Initiated Contact with Patient 01/27/11 1747      Chief Complaint  Patient presents with  . Shortness of Breath  . Cough    (Consider location/radiation/quality/duration/timing/severity/associated sxs/prior treatment) HPI Patient with cough, rhinorrhea, nasal  Congestion began on Tuesday.  Fever began on Thursday to 101.  Cough productive of yellow sputum.  Some dyspnea noted.  No vomiting one episode of diarrhea.  Patient is not a smoker.     Past Medical History  Diagnosis Date  . Hypertension   . Thyroid disease     Past Surgical History  Procedure Date  . Abdominal surgery   . Hernia repair     History reviewed. No pertinent family history.  History  Substance Use Topics  . Smoking status: Not on file  . Smokeless tobacco: Not on file  . Alcohol Use: No    OB History    Grav Para Term Preterm Abortions TAB SAB Ect Mult Living                  Review of Systems  All other systems reviewed and are negative.    Allergies  Review of patient's allergies indicates no known allergies.  Home Medications  No current outpatient prescriptions on file.  BP 146/62  Pulse 86  Temp(Src) 99.3 F (37.4 C) (Oral)  Resp 20  Ht 5\' 4"  (1.626 m)  Wt 240 lb (108.863 kg)  BMI 41.20 kg/m2  SpO2 95%  Physical Exam  Vitals reviewed. Constitutional: She is oriented to person, place, and time. She appears well-developed and well-nourished.  HENT:  Head: Normocephalic and atraumatic.  Right Ear: External ear normal.  Left Ear: External ear normal.  Nose: Nose normal.  Mouth/Throat: Oropharynx is clear and moist.  Eyes: Conjunctivae are normal. Pupils are equal, round, and reactive to light.  Neck: Normal range of motion. Neck supple.  Cardiovascular: Normal rate, regular rhythm and normal heart sounds.   Pulmonary/Chest: Effort normal. She has rhonchi in the right lower field and the  left lower field.  Abdominal: Soft. Bowel sounds are normal.  Musculoskeletal: Normal range of motion.  Neurological: She is alert and oriented to person, place, and time. She has normal reflexes.  Skin: Skin is warm and dry.  Psychiatric: She has a normal mood and affect. Her behavior is normal. Judgment and thought content normal.    ED Course  Procedures (including critical care time)  Labs Reviewed - No data to display No results found.   No diagnosis found.    MDM     No infiltrate seen on cxr.  Patient with influenza like symptoms and history of asthma but no acute distress.  Symptoms better after neb.  Patietn with symptoms outside of window for tamiflu .  Plan prednisone and close follow up with pmd.     Hilario Quarry, MD 01/27/11 2027

## 2011-01-27 NOTE — ED Notes (Signed)
Pt states cough/cold like symptoms started Thursday with body aches, headache, cough and fever. URI has progressed to productive cough with no fever, and pink tinged sputum

## 2011-01-27 NOTE — ED Notes (Signed)
RTT at bedside, examined pt and initiated neb treatment. Pt tolerating well.

## 2011-12-19 ENCOUNTER — Emergency Department (HOSPITAL_COMMUNITY)
Admission: EM | Admit: 2011-12-19 | Discharge: 2011-12-19 | Disposition: A | Discharge status: Home / Self Care | Payer: 59 | Attending: Emergency Medicine | Admitting: Emergency Medicine

## 2011-12-19 ENCOUNTER — Emergency Department (HOSPITAL_COMMUNITY): Payer: 59

## 2011-12-19 ENCOUNTER — Encounter (HOSPITAL_COMMUNITY): Payer: Self-pay | Admitting: Emergency Medicine

## 2011-12-19 DIAGNOSIS — Z79899 Other long term (current) drug therapy: Secondary | ICD-10-CM | POA: Insufficient documentation

## 2011-12-19 DIAGNOSIS — Z87891 Personal history of nicotine dependence: Secondary | ICD-10-CM | POA: Insufficient documentation

## 2011-12-19 DIAGNOSIS — E079 Disorder of thyroid, unspecified: Secondary | ICD-10-CM | POA: Insufficient documentation

## 2011-12-19 DIAGNOSIS — Y9389 Activity, other specified: Secondary | ICD-10-CM | POA: Insufficient documentation

## 2011-12-19 DIAGNOSIS — Y9289 Other specified places as the place of occurrence of the external cause: Secondary | ICD-10-CM | POA: Insufficient documentation

## 2011-12-19 DIAGNOSIS — IMO0002 Reserved for concepts with insufficient information to code with codable children: Secondary | ICD-10-CM | POA: Insufficient documentation

## 2011-12-19 DIAGNOSIS — S63509A Unspecified sprain of unspecified wrist, initial encounter: Secondary | ICD-10-CM | POA: Insufficient documentation

## 2011-12-19 DIAGNOSIS — S63502A Unspecified sprain of left wrist, initial encounter: Secondary | ICD-10-CM

## 2011-12-19 DIAGNOSIS — I10 Essential (primary) hypertension: Secondary | ICD-10-CM | POA: Insufficient documentation
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
## 2011-12-19 NOTE — ED Provider Notes (Signed)
Medical screening examination/treatment/procedure(s) were performed by non-physician practitioner and as supervising physician I was immediately available for consultation/collaboration.  Gerhard Munch, MD 12/19/11 2001
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
## 2012-04-10 ENCOUNTER — Encounter (HOSPITAL_COMMUNITY): Payer: Self-pay | Admitting: Emergency Medicine

## 2012-04-10 ENCOUNTER — Emergency Department (HOSPITAL_COMMUNITY)
Admission: EM | Admit: 2012-04-10 | Discharge: 2012-04-10 | Disposition: A | Discharge status: Home / Self Care | Payer: 59 | Attending: Emergency Medicine | Admitting: Emergency Medicine

## 2012-04-10 ENCOUNTER — Emergency Department (HOSPITAL_COMMUNITY): Payer: 59

## 2012-04-10 DIAGNOSIS — I10 Essential (primary) hypertension: Secondary | ICD-10-CM | POA: Insufficient documentation

## 2012-04-10 DIAGNOSIS — Z79899 Other long term (current) drug therapy: Secondary | ICD-10-CM | POA: Insufficient documentation

## 2012-04-10 DIAGNOSIS — Z7982 Long term (current) use of aspirin: Secondary | ICD-10-CM | POA: Insufficient documentation

## 2012-04-10 DIAGNOSIS — J40 Bronchitis, not specified as acute or chronic: Secondary | ICD-10-CM | POA: Insufficient documentation

## 2012-04-10 DIAGNOSIS — J45909 Unspecified asthma, uncomplicated: Secondary | ICD-10-CM | POA: Insufficient documentation

## 2012-04-10 DIAGNOSIS — R059 Cough, unspecified: Secondary | ICD-10-CM | POA: Insufficient documentation

## 2012-04-10 DIAGNOSIS — E079 Disorder of thyroid, unspecified: Secondary | ICD-10-CM | POA: Insufficient documentation

## 2012-04-10 DIAGNOSIS — R062 Wheezing: Secondary | ICD-10-CM | POA: Insufficient documentation

## 2012-04-10 MED ORDER — ALBUTEROL SULFATE HFA 108 (90 BASE) MCG/ACT IN AERS
2.0000 | INHALATION_SPRAY | RESPIRATORY_TRACT | Status: DC
  Administered 2012-04-10: 2 via RESPIRATORY_TRACT
  Filled 2012-04-10: qty 6.7

## 2012-04-10 MED ORDER — AMOXICILLIN 500 MG PO CAPS: 500.0000 mg | ORAL_CAPSULE | Freq: Three times a day (TID) | ORAL | Status: DC

## 2012-04-10 MED ORDER — PREDNISONE 10 MG PO TABS: 20.0000 mg | ORAL_TABLET | Freq: Every day | ORAL | Status: DC

## 2012-04-10 MED ORDER — ALBUTEROL SULFATE (5 MG/ML) 0.5% IN NEBU
5.0000 mg | INHALATION_SOLUTION | Freq: Once | RESPIRATORY_TRACT | Status: AC
  Administered 2012-04-10: 5 mg via RESPIRATORY_TRACT
  Filled 2012-04-10: qty 0.5

## 2012-04-10 MED ORDER — IPRATROPIUM BROMIDE 0.02 % IN SOLN
0.5000 mg | Freq: Once | RESPIRATORY_TRACT | Status: AC
  Administered 2012-04-10: 0.5 mg via RESPIRATORY_TRACT
  Filled 2012-04-10: qty 2.5

## 2012-04-10 NOTE — ED Notes (Signed)
Patient c/o shortness of breath since Monday which has been aggravated by perfume and Clorox recently. Per patient productive cough with thick brown sputum. Denies any fevers. Per patient hx of asthma, used last dose of inhaler this morning at 3.

## 2012-04-10 NOTE — ED Provider Notes (Signed)
History    This chart was scribed for Benny Lennert, MD, by Frederik Pear, ED scribe. The patient was seen in room APA02/APA02 and the patient's care was started at 1207.    CSN: [id number]  Arrival date & time 04/10/12  1014   First MD Initiated Contact with Patient 04/10/12 1207      Chief Complaint  Patient presents with  . Shortness of Breath  . Asthma    (Consider location/radiation/quality/duration/timing/severity/associated sxs/prior treatment) Patient is a 52 y.o. female presenting with shortness of breath and asthma. The history is provided by the patient. No language interpreter was used.  Shortness of Breath Onset quality:  Gradual Duration:  4 days Timing:  Constant Progression:  Worsening Exacerbated by: perfume and chlorox. Associated symptoms: cough and wheezing   Associated symptoms: no abdominal pain, no chest pain, no headaches and no rash   Asthma Associated symptoms include shortness of breath. Pertinent negatives include no chest pain, no abdominal pain and no headaches.    Hannah Potter is a 52 y.o. female with a h/o of asthma who presents to the Emergency Department complaining of gradually worsening, constant SOB that is aggravated by perfume and Clorox with associated productive cough with thick brown sputum and wheezing that began 4 days ago. She denies any associated fever. She reports that she treated the symptoms with an albuterol inhaler at 0300 with no relief.   Past Medical History  Diagnosis Date  . Hypertension   . Thyroid disease     Past Surgical History  Procedure Laterality Date  . Abdominal surgery    . Hernia repair    . Abdominal hysterectomy      Family History  Problem Relation Age of Onset  . Diabetes Father   . Hypertension Father     History  Substance Use Topics  . Smoking status: Never Smoker   . Smokeless tobacco: Never Used  . Alcohol Use: No    OB History   Grav Para Term Preterm Abortions TAB SAB Ect  Mult Living   3 2 2  1 1    2       Review of Systems  Constitutional: Negative for fatigue.  HENT: Negative for congestion, sinus pressure and ear discharge.   Eyes: Negative for discharge.  Respiratory: Positive for cough, shortness of breath and wheezing.   Cardiovascular: Negative for chest pain.  Gastrointestinal: Negative for abdominal pain and diarrhea.  Genitourinary: Negative for frequency and hematuria.  Musculoskeletal: Negative for back pain.  Skin: Negative for rash.  Neurological: Negative for seizures and headaches.  Psychiatric/Behavioral: Negative for hallucinations.  All other systems reviewed and are negative.    Allergies  Review of patient's allergies indicates no known allergies.  Home Medications   Current Outpatient Rx  Name  Route  Sig  Dispense  Refill  . albuterol (PROAIR HFA) 108 (90 BASE) MCG/ACT inhaler   Inhalation   Inhale 2 puffs into the lungs every 4 (four) hours as needed for shortness of breath.          Marland Kitchen aspirin 81 MG chewable tablet   Oral   Chew 81 mg by mouth daily.         . cetirizine (ZYRTEC) 10 MG tablet   Oral   Take 10 mg by mouth daily.         . hydrochlorothiazide (HYDRODIURIL) 25 MG tablet   Oral   Take 25 mg by mouth daily.         Marland Kitchen  levothyroxine (SYNTHROID, LEVOTHROID) 150 MCG tablet   Oral   Take 150 mcg by mouth daily.           Marland Kitchen Phenyleph-Doxyl-DM-Aspirin (ALKA-SELTZER PLUS DAY/NIGHT PO)   Oral   Take 1 tablet by mouth every 4 (four) hours as needed (Cold Symptoms).         . budesonide-formoterol (SYMBICORT) 160-4.5 MCG/ACT inhaler   Inhalation   Inhale 2 puffs into the lungs 2 (two) times daily as needed (Shortness of Breath).            BP 124/71  Pulse 90  Temp(Src) 98.9 F (37.2 C) (Oral)  Resp 22  Ht 5\' 4"  (1.626 m)  Wt 240 lb (108.863 kg)  BMI 41.18 kg/m2  SpO2 92%  LMP 02/12/1998  Physical Exam  Nursing note and vitals reviewed. Constitutional: She is oriented to  person, place, and time. She appears well-developed.  HENT:  Head: Normocephalic and atraumatic.  Eyes: Conjunctivae and EOM are normal. No scleral icterus.  Neck: Neck supple. No thyromegaly present.  Cardiovascular: Normal rate and regular rhythm.  Exam reveals no gallop and no friction rub.   No murmur heard. Pulmonary/Chest: Effort normal and breath sounds normal. No stridor. She has no wheezes. She has no rales. She exhibits no tenderness.  Abdominal: She exhibits no distension. There is no tenderness. There is no rebound.  Musculoskeletal: Normal range of motion. She exhibits no edema.  Lymphadenopathy:    She has no cervical adenopathy.  Neurological: She is oriented to person, place, and time. Coordination normal.  Skin: No rash noted. No erythema.  Psychiatric: She has a normal mood and affect. Her behavior is normal.    ED Course  Procedures (including critical care time)  DIAGNOSTIC STUDIES: Oxygen Saturation is 92% on room air, adequate by my interpretation.    COORDINATION OF CARE:  12:45- Discussed planned course of treatment with the patient, including a chest X-ray, who is agreeable at this time.  Labs Reviewed - No data to display Dg Chest 2 View  04/10/2012  *RADIOLOGY REPORT*  Clinical Data: Shortness of breath.  Asthma and cough.  CHEST - 2 VIEW  Comparison: 01/27/2011  Findings: Midline trachea.  Patient rotated minimally left. Normal heart size and mediastinal contours. No pleural effusion or pneumothorax.  Mildly low lung volumes. Clear lungs.  IMPRESSION: No acute cardiopulmonary disease.   Original Report Authenticated By: Jeronimo Greaves, M.D.      No diagnosis found.    MDM   The chart was scribed for me under my direct supervision.  I personally performed the history, physical, and medical decision making and all procedures in the evaluation of this patient.Benny Lennert, MD 04/10/12 [phone]

## 2012-04-10 NOTE — ED Notes (Signed)
MD at bedside. 

## 2012-12-29 ENCOUNTER — Encounter (HOSPITAL_COMMUNITY): Payer: Self-pay | Admitting: Emergency Medicine

## 2012-12-29 ENCOUNTER — Emergency Department (HOSPITAL_COMMUNITY)
Admission: EM | Admit: 2012-12-29 | Discharge: 2012-12-29 | Disposition: A | Discharge status: Home / Self Care | Payer: Managed Care, Other (non HMO) | Attending: Emergency Medicine | Admitting: Emergency Medicine

## 2012-12-29 DIAGNOSIS — J45901 Unspecified asthma with (acute) exacerbation: Secondary | ICD-10-CM | POA: Insufficient documentation

## 2012-12-29 DIAGNOSIS — E079 Disorder of thyroid, unspecified: Secondary | ICD-10-CM | POA: Insufficient documentation

## 2012-12-29 DIAGNOSIS — J3489 Other specified disorders of nose and nasal sinuses: Secondary | ICD-10-CM | POA: Insufficient documentation

## 2012-12-29 DIAGNOSIS — Z792 Long term (current) use of antibiotics: Secondary | ICD-10-CM | POA: Insufficient documentation

## 2012-12-29 DIAGNOSIS — I10 Essential (primary) hypertension: Secondary | ICD-10-CM | POA: Insufficient documentation

## 2012-12-29 DIAGNOSIS — IMO0002 Reserved for concepts with insufficient information to code with codable children: Secondary | ICD-10-CM | POA: Insufficient documentation

## 2012-12-29 DIAGNOSIS — Z7982 Long term (current) use of aspirin: Secondary | ICD-10-CM | POA: Insufficient documentation

## 2012-12-29 DIAGNOSIS — Z79899 Other long term (current) drug therapy: Secondary | ICD-10-CM | POA: Insufficient documentation

## 2012-12-29 HISTORY — DX: Unspecified asthma, uncomplicated: J45.909

## 2012-12-29 HISTORY — DX: Bronchitis, not specified as acute or chronic: J40

## 2012-12-29 HISTORY — DX: Unspecified disorder of nose and nasal sinuses: J34.9

## 2012-12-29 MED ORDER — PREDNISONE 10 MG PO TABS: ORAL_TABLET | ORAL | Status: DC

## 2012-12-29 MED ORDER — ALBUTEROL SULFATE HFA 108 (90 BASE) MCG/ACT IN AERS
2.0000 | INHALATION_SPRAY | RESPIRATORY_TRACT | Status: DC
  Administered 2012-12-29: 2 via RESPIRATORY_TRACT
  Filled 2012-12-29: qty 6.7

## 2012-12-29 MED ORDER — IPRATROPIUM BROMIDE 0.02 % IN SOLN
0.5000 mg | Freq: Once | RESPIRATORY_TRACT | Status: AC
  Administered 2012-12-29: 0.5 mg via RESPIRATORY_TRACT
  Filled 2012-12-29: qty 2.5

## 2012-12-29 MED ORDER — ALBUTEROL SULFATE (5 MG/ML) 0.5% IN NEBU
5.0000 mg | INHALATION_SOLUTION | Freq: Once | RESPIRATORY_TRACT | Status: AC
  Administered 2012-12-29: 5 mg via RESPIRATORY_TRACT
  Filled 2012-12-29: qty 1

## 2012-12-29 MED ORDER — PREDNISONE 50 MG PO TABS
60.0000 mg | ORAL_TABLET | Freq: Once | ORAL | Status: AC
  Administered 2012-12-29: 60 mg via ORAL
  Filled 2012-12-29 (×2): qty 1

## 2012-12-29 NOTE — ED Notes (Signed)
Non productive cough began today. States she became sob at 1320 today while at work and she left work at 1600. NAD at this time.

## 2012-12-29 NOTE — ED Notes (Signed)
Sudden onset this afternoon with sob, wheezing.and cough.  Says she used inhaler without relief.

## 2012-12-29 NOTE — ED Provider Notes (Signed)
Medical screening examination/treatment/procedure(s) were performed by non-physician practitioner and as supervising physician I was immediately available for consultation/collaboration.  EKG Interpretation   None         Lyanne Co, MD 12/29/12 [phone]

## 2012-12-29 NOTE — ED Notes (Signed)
HHN started. By respiratory .

## 2012-12-29 NOTE — ED Provider Notes (Signed)
CSN: [id number]     Arrival date & time 12/29/12  1608 History   First MD Initiated Contact with Patient 12/29/12 1651     Chief Complaint  Patient presents with  . Cough  . Shortness of Breath   (Consider location/radiation/quality/duration/timing/severity/associated sxs/prior Treatment) Patient is a 52 y.o. female presenting with cough and shortness of breath. The history is provided by the patient.  Cough Cough characteristics:  Non-productive Severity:  Moderate Duration:  1 day Timing:  Intermittent Progression:  Worsening Chronicity:  Recurrent Smoker: no   Context: upper respiratory infection and weather changes   Relieved by:  Nothing Associated symptoms: rhinorrhea, shortness of breath, sinus congestion and wheezing   Associated symptoms: no chest pain, no chills, no diaphoresis, no eye discharge, no fever, no rash and no sore throat   Shortness of Breath Associated symptoms: cough and wheezing   Associated symptoms: no abdominal pain, no chest pain, no diaphoresis, no fever, no neck pain, no rash and no sore throat     Past Medical History  Diagnosis Date  . Hypertension   . Thyroid disease   . Asthma   . Sinus problem   . Bronchitis    Past Surgical History  Procedure Laterality Date  . Abdominal surgery    . Hernia repair    . Abdominal hysterectomy     Family History  Problem Relation Age of Onset  . Diabetes Father   . Hypertension Father    History  Substance Use Topics  . Smoking status: Never Smoker   . Smokeless tobacco: Never Used  . Alcohol Use: No   OB History   Grav Para Term Preterm Abortions TAB SAB Ect Mult Living   3 2 2  1 1    2      Review of Systems  Constitutional: Negative for fever, chills, diaphoresis and activity change.       All ROS Neg except as noted in HPI  HENT: Positive for rhinorrhea. Negative for nosebleeds and sore throat.   Eyes: Negative for photophobia and discharge.  Respiratory: Positive for cough,  shortness of breath and wheezing.   Cardiovascular: Negative for chest pain and palpitations.  Gastrointestinal: Negative for abdominal pain and blood in stool.  Genitourinary: Negative for dysuria, frequency and hematuria.  Musculoskeletal: Negative for arthralgias, back pain and neck pain.  Skin: Negative.  Negative for rash.  Neurological: Negative for dizziness, seizures and speech difficulty.  Psychiatric/Behavioral: Negative for hallucinations and confusion.    Allergies  Review of patient's allergies indicates no known allergies.  Home Medications   Current Outpatient Rx  Name  Route  Sig  Dispense  Refill  . albuterol (PROAIR HFA) 108 (90 BASE) MCG/ACT inhaler   Inhalation   Inhale 2 puffs into the lungs every 4 (four) hours as needed for shortness of breath.          Marland Kitchen amoxicillin (AMOXIL) 500 MG capsule   Oral   Take 1 capsule (500 mg total) by mouth 3 (three) times daily.   21 capsule   0   . aspirin 81 MG chewable tablet   Oral   Chew 81 mg by mouth daily.         . budesonide-formoterol (SYMBICORT) 160-4.5 MCG/ACT inhaler   Inhalation   Inhale 2 puffs into the lungs 2 (two) times daily as needed (Shortness of Breath).          . cetirizine (ZYRTEC) 10 MG tablet   Oral   Take  10 mg by mouth daily.         . hydrochlorothiazide (HYDRODIURIL) 25 MG tablet   Oral   Take 25 mg by mouth daily.         Marland Kitchen levothyroxine (SYNTHROID, LEVOTHROID) 150 MCG tablet   Oral   Take 150 mcg by mouth daily.           Marland Kitchen Phenyleph-Doxyl-DM-Aspirin (ALKA-SELTZER PLUS DAY/NIGHT PO)   Oral   Take 1 tablet by mouth every 4 (four) hours as needed (Cold Symptoms).         . predniSONE (DELTASONE) 10 MG tablet   Oral   Take 2 tablets (20 mg total) by mouth daily.   14 tablet   0    BP 115/52  Pulse 80  Temp(Src) 98.3 F (36.8 C) (Oral)  Resp 20  SpO2 94%  LMP 02/12/1998 Physical Exam  Nursing note and vitals reviewed. Constitutional: She is oriented to  person, place, and time. She appears well-developed and well-nourished.  Non-toxic appearance.  HENT:  Head: Normocephalic.  Right Ear: Tympanic membrane and external ear normal.  Left Ear: Tympanic membrane and external ear normal.  Mild to mod nasal congestion.  Eyes: EOM and lids are normal. Pupils are equal, round, and reactive to light.  Neck: Normal range of motion. Neck supple. Carotid bruit is not present.  Cardiovascular: Normal rate, regular rhythm, normal heart sounds, intact distal pulses and normal pulses.   Pulmonary/Chest: No accessory muscle usage. No respiratory distress. She has decreased breath sounds. She has wheezes.  Abdominal: Soft. Bowel sounds are normal. There is no tenderness. There is no guarding.  Musculoskeletal: Normal range of motion.  Lymphadenopathy:       Head (right side): No submandibular adenopathy present.       Head (left side): No submandibular adenopathy present.    She has no cervical adenopathy.  Neurological: She is alert and oriented to person, place, and time. She has normal strength. No cranial nerve deficit or sensory deficit.  Skin: Skin is warm and dry.  Psychiatric: She has a normal mood and affect. Her speech is normal.    ED Course  Procedures (including critical care time) Labs Review Labs Reviewed - No data to display Imaging Review No results found.  EKG Interpretation   None       MDM  No diagnosis found. **I have reviewed nursing notes, vital signs, and all appropriate lab and imaging results for this patient.*  6:05pm - Wheezing much improved after neb treatment. Pt now speaking in complete sentences. Some wheezing still present. 6:36pm - Wheezing continues to improve. Pt continues to speak in complete sentences. Ambulatory with out problem.  Plan - Pt given albuterol inhaler and Rx for prednisone. Pt to return to ED if any changes or problem.  Kathie Dike, PA-C 12/29/12 [place]

## 2013-11-30 ENCOUNTER — Emergency Department (HOSPITAL_COMMUNITY)
Admission: EM | Admit: 2013-11-30 | Discharge: 2013-11-30 | Disposition: A | Discharge status: Home / Self Care | Payer: 59 | Attending: Emergency Medicine | Admitting: Emergency Medicine

## 2013-11-30 ENCOUNTER — Encounter (HOSPITAL_COMMUNITY): Payer: Self-pay | Admitting: Emergency Medicine

## 2013-11-30 ENCOUNTER — Emergency Department (HOSPITAL_COMMUNITY): Payer: 59

## 2013-11-30 DIAGNOSIS — Y9389 Activity, other specified: Secondary | ICD-10-CM | POA: Insufficient documentation

## 2013-11-30 DIAGNOSIS — Z7952 Long term (current) use of systemic steroids: Secondary | ICD-10-CM | POA: Insufficient documentation

## 2013-11-30 DIAGNOSIS — Z7982 Long term (current) use of aspirin: Secondary | ICD-10-CM | POA: Insufficient documentation

## 2013-11-30 DIAGNOSIS — S63502A Unspecified sprain of left wrist, initial encounter: Secondary | ICD-10-CM

## 2013-11-30 DIAGNOSIS — J45909 Unspecified asthma, uncomplicated: Secondary | ICD-10-CM | POA: Insufficient documentation

## 2013-11-30 DIAGNOSIS — Y92481 Parking lot as the place of occurrence of the external cause: Secondary | ICD-10-CM | POA: Insufficient documentation

## 2013-11-30 DIAGNOSIS — Z7951 Long term (current) use of inhaled steroids: Secondary | ICD-10-CM | POA: Insufficient documentation

## 2013-11-30 DIAGNOSIS — E079 Disorder of thyroid, unspecified: Secondary | ICD-10-CM | POA: Insufficient documentation

## 2013-11-30 DIAGNOSIS — I10 Essential (primary) hypertension: Secondary | ICD-10-CM | POA: Insufficient documentation

## 2013-11-30 DIAGNOSIS — Z792 Long term (current) use of antibiotics: Secondary | ICD-10-CM | POA: Insufficient documentation

## 2013-11-30 DIAGNOSIS — Z79899 Other long term (current) drug therapy: Secondary | ICD-10-CM | POA: Insufficient documentation

## 2013-11-30 DIAGNOSIS — S6992XA Unspecified injury of left wrist, hand and finger(s), initial encounter: Secondary | ICD-10-CM | POA: Insufficient documentation

## 2013-11-30 NOTE — Discharge Instructions (Signed)
Continue to take the ibuprofen, wear the splint for comfort, apply ice, elevate and rest the area. Follow up with your doctor if symptoms persist.
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
## 2013-11-30 NOTE — ED Notes (Signed)
Called to place patient in room. No answer.  
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
## 2013-12-03 NOTE — ED Provider Notes (Signed)
Medical screening examination/treatment/procedure(s) were performed by non-physician practitioner and as supervising physician I was immediately available for consultation/collaboration.     Prabhjot Maddux, MD 12/03/13 0709 

## 2013-12-14 ENCOUNTER — Encounter (HOSPITAL_COMMUNITY): Payer: Self-pay | Admitting: Emergency Medicine

## 2015-04-14 ENCOUNTER — Emergency Department (HOSPITAL_COMMUNITY)
Admission: EM | Admit: 2015-04-14 | Discharge: 2015-04-14 | Disposition: A | Discharge status: Home / Self Care | Payer: BLUE CROSS/BLUE SHIELD | Attending: Emergency Medicine | Admitting: Emergency Medicine

## 2015-04-14 ENCOUNTER — Encounter (HOSPITAL_COMMUNITY): Payer: Self-pay | Admitting: Emergency Medicine

## 2015-04-14 ENCOUNTER — Emergency Department (HOSPITAL_COMMUNITY): Payer: BLUE CROSS/BLUE SHIELD

## 2015-04-14 DIAGNOSIS — Z7982 Long term (current) use of aspirin: Secondary | ICD-10-CM | POA: Diagnosis not present

## 2015-04-14 DIAGNOSIS — Z7951 Long term (current) use of inhaled steroids: Secondary | ICD-10-CM | POA: Diagnosis not present

## 2015-04-14 DIAGNOSIS — Z792 Long term (current) use of antibiotics: Secondary | ICD-10-CM | POA: Insufficient documentation

## 2015-04-14 DIAGNOSIS — J45901 Unspecified asthma with (acute) exacerbation: Secondary | ICD-10-CM | POA: Diagnosis not present

## 2015-04-14 DIAGNOSIS — I10 Essential (primary) hypertension: Secondary | ICD-10-CM | POA: Diagnosis not present

## 2015-04-14 DIAGNOSIS — E079 Disorder of thyroid, unspecified: Secondary | ICD-10-CM | POA: Diagnosis not present

## 2015-04-14 DIAGNOSIS — R05 Cough: Secondary | ICD-10-CM | POA: Diagnosis present

## 2015-04-14 DIAGNOSIS — Z79899 Other long term (current) drug therapy: Secondary | ICD-10-CM | POA: Diagnosis not present

## 2015-04-14 MED ORDER — ALBUTEROL SULFATE (2.5 MG/3ML) 0.083% IN NEBU
2.5000 mg | INHALATION_SOLUTION | Freq: Once | RESPIRATORY_TRACT | Status: AC
  Administered 2015-04-14: 2.5 mg via RESPIRATORY_TRACT
  Filled 2015-04-14: qty 3

## 2015-04-14 MED ORDER — PREDNISONE 50 MG PO TABS: ORAL_TABLET | ORAL | Status: DC

## 2015-04-14 MED ORDER — PREDNISONE 50 MG PO TABS
60.0000 mg | ORAL_TABLET | Freq: Once | ORAL | Status: AC
  Administered 2015-04-14: 60 mg via ORAL
  Filled 2015-04-14: qty 1

## 2015-04-14 MED ORDER — IPRATROPIUM-ALBUTEROL 0.5-2.5 (3) MG/3ML IN SOLN
3.0000 mL | Freq: Once | RESPIRATORY_TRACT | Status: AC
  Administered 2015-04-14: 3 mL via RESPIRATORY_TRACT
  Filled 2015-04-14: qty 3

## 2015-04-14 NOTE — ED Notes (Signed)
PT c/o nasal congestion and sinus pressure x1 week with productive yellow cough. PT states she was at work today and there was a strong chemical smell and agitated her coughing while at work. PT stated she drove herself to ED and denies any SOB at this time.

## 2015-04-14 NOTE — ED Notes (Signed)
Respiratory Therapy notified

## 2015-04-14 NOTE — ED Provider Notes (Signed)
CSN: [id number]     Arrival date & time 04/14/15  1622 History   First MD Initiated Contact with Patient 04/14/15 1648     Chief Complaint  Patient presents with  . Cough     (Consider location/radiation/quality/duration/timing/severity/associated sxs/prior Treatment) The history is provided by the patient.   Hannah Potter is a 55 y.o. female presenting with a one week history of nasal congestion and clear rhinorrhea along with a cough productive of yellow sputum.  Today at work she was exposed to a strong chemical smell which caused increased coughing and now wheezing and shortness of breath.  She does use albuterol but has not taken prior to arrival here.  She is also on Symbicort along with Zyrtec for allergy relief daily.  She denies chest pain, fevers or chills.  She is on no relievers for her symptoms.    Past Medical History  Diagnosis Date  . Hypertension   . Thyroid disease   . Asthma   . Sinus problem   . Bronchitis    Past Surgical History  Procedure Laterality Date  . Abdominal surgery    . Hernia repair    . Abdominal hysterectomy     Family History  Problem Relation Age of Onset  . Diabetes Father   . Hypertension Father    Social History  Substance Use Topics  . Smoking status: Never Smoker   . Smokeless tobacco: Never Used  . Alcohol Use: No   OB History    Gravida Para Term Preterm AB TAB SAB Ectopic Multiple Living   Review of Systems  Constitutional: Negative for fever and chills.  HENT: Positive for congestion and rhinorrhea. Negative for ear pain, sinus pressure, sore throat, trouble swallowing and voice change.   Eyes: Negative for discharge.  Respiratory: Positive for cough, shortness of breath and wheezing. Negative for stridor.   Cardiovascular: Negative for chest pain.  Gastrointestinal: Negative for abdominal pain.  Genitourinary: Negative.       Allergies  Review of patient's allergies indicates no known  allergies.  Home Medications   Prior to Admission medications   Medication Sig Start Date End Date Taking? Authorizing Provider  albuterol (PROAIR HFA) 108 (90 BASE) MCG/ACT inhaler Inhale 2 puffs into the lungs every 4 (four) hours as needed for shortness of breath.     Historical Provider, MD  amoxicillin (AMOXIL) 500 MG capsule Take 1 capsule (500 mg total) by mouth 3 (three) times daily. 04/10/12   Bethann Berkshire, MD  aspirin 81 MG chewable tablet Chew 81 mg by mouth daily.    Historical Provider, MD  budesonide-formoterol (SYMBICORT) 160-4.5 MCG/ACT inhaler Inhale 2 puffs into the lungs 2 (two) times daily as needed (Shortness of Breath).     Historical Provider, MD  cetirizine (ZYRTEC) 10 MG tablet Take 10 mg by mouth daily.    Historical Provider, MD  hydrochlorothiazide (HYDRODIURIL) 25 MG tablet Take 25 mg by mouth daily.    Historical Provider, MD  levothyroxine (SYNTHROID, LEVOTHROID) 150 MCG tablet Take 150 mcg by mouth daily.      Historical Provider, MD  Phenyleph-Doxyl-DM-Aspirin (ALKA-SELTZER PLUS DAY/NIGHT PO) Take 1 tablet by mouth every 4 (four) hours as needed (Cold Symptoms).    Historical Provider, MD  predniSONE (DELTASONE) 50 MG tablet Take one tablet daily starting 04/15/15. 04/14/15   Burgess Amor, PA-C   BP 126/51 mmHg  Pulse 88  Temp(Src) 99.4  F (37.4 C) (Oral)  Resp 24  Ht  (1.626 m)  Wt 110.224 kg  BMI 41.69 kg/m2  SpO2 92%  LMP 02/12/1998 Physical Exam  Constitutional: She is oriented to person, place, and time. She appears well-developed and well-nourished.  HENT:  Head: Normocephalic and atraumatic.  Right Ear: Tympanic membrane and ear canal normal.  Left Ear: Tympanic membrane and ear canal normal.  Nose: Mucosal edema and rhinorrhea present.  Mouth/Throat: Uvula is midline, oropharynx is clear and moist and mucous membranes are normal. No oropharyngeal exudate, posterior oropharyngeal edema, posterior oropharyngeal erythema or tonsillar abscesses.   Eyes: Conjunctivae are normal.  Cardiovascular: Normal rate and normal heart sounds.   Pulmonary/Chest: Effort normal. No respiratory distress. She has wheezes. She has no rales.  Bilateral expiratory wheezing. Prolonged expirations.  Abdominal: Soft. There is no tenderness.  Musculoskeletal: Normal range of motion.  Neurological: She is alert and oriented to person, place, and time.  Skin: Skin is warm and dry. No rash noted.  Psychiatric: She has a normal mood and affect.    ED Course  Procedures (including critical care time) Labs Review Labs Reviewed - No data to display  Imaging Review Dg Chest 2 View  04/14/2015  CLINICAL DATA:  Nasal congestion, wheezing, shortness of breath, cough EXAM: CHEST  2 VIEW COMPARISON:  04/10/2012 FINDINGS: Cardiomediastinal silhouette is stable. No acute infiltrate or pleural effusion. No pulmonary edema. Bony thorax is unremarkable. IMPRESSION: No active cardiopulmonary disease. Electronically Signed   By: Natasha Mead M.D.   On: 04/14/2015 17:50   I have personally reviewed and evaluated these images and lab results as part of my medical decision-making.   EKG Interpretation None      MDM   Final diagnoses:  Asthma attack    Pt was given albuterol/atrovent neb along with prednisone 60 mg PO.  Her wheezing nearly resolved, much better aeration at reexam.  She ambulated without sob or drop in oxygenation.  She was placed on prednisone pulse doses, endorses she has her nebs/inhalers - advised use q 4 hours for any cough or wheeze.  Advised recheck here or with pcp for any worsened sx.    The patient appears reasonably screened and/or stabilized for discharge and I doubt any other medical condition or other [facility] requiring further screening, evaluation, or treatment in the ED at this time prior to discharge.     Burgess Amor, PA-C 04/15/15 0011  Samuel Jester, DO 04/17/15 1556

## 2015-04-14 NOTE — Discharge Instructions (Signed)

## 2015-04-14 NOTE — ED Notes (Signed)
RT paged.

## 2016-02-24 ENCOUNTER — Emergency Department (HOSPITAL_COMMUNITY)
Admission: EM | Admit: 2016-02-24 | Discharge: 2016-02-24 | Disposition: A | Discharge status: Home / Self Care | Payer: BLUE CROSS/BLUE SHIELD | Attending: Emergency Medicine | Admitting: Emergency Medicine

## 2016-02-24 ENCOUNTER — Encounter (HOSPITAL_COMMUNITY): Payer: Self-pay | Admitting: Emergency Medicine

## 2016-02-24 ENCOUNTER — Emergency Department (HOSPITAL_COMMUNITY): Payer: BLUE CROSS/BLUE SHIELD

## 2016-02-24 DIAGNOSIS — I10 Essential (primary) hypertension: Secondary | ICD-10-CM | POA: Diagnosis not present

## 2016-02-24 DIAGNOSIS — S6392XA Sprain of unspecified part of left wrist and hand, initial encounter: Secondary | ICD-10-CM | POA: Diagnosis not present

## 2016-02-24 DIAGNOSIS — J45909 Unspecified asthma, uncomplicated: Secondary | ICD-10-CM | POA: Insufficient documentation

## 2016-02-24 DIAGNOSIS — Y92410 Unspecified street and highway as the place of occurrence of the external cause: Secondary | ICD-10-CM | POA: Diagnosis not present

## 2016-02-24 DIAGNOSIS — Y9389 Activity, other specified: Secondary | ICD-10-CM | POA: Insufficient documentation

## 2016-02-24 DIAGNOSIS — Z79899 Other long term (current) drug therapy: Secondary | ICD-10-CM | POA: Insufficient documentation

## 2016-02-24 DIAGNOSIS — Z7982 Long term (current) use of aspirin: Secondary | ICD-10-CM | POA: Insufficient documentation

## 2016-02-24 DIAGNOSIS — S63502A Unspecified sprain of left wrist, initial encounter: Secondary | ICD-10-CM

## 2016-02-24 DIAGNOSIS — T148XXA Other injury of unspecified body region, initial encounter: Secondary | ICD-10-CM

## 2016-02-24 DIAGNOSIS — Y999 Unspecified external cause status: Secondary | ICD-10-CM | POA: Insufficient documentation

## 2016-02-24 DIAGNOSIS — S6992XA Unspecified injury of left wrist, hand and finger(s), initial encounter: Secondary | ICD-10-CM | POA: Diagnosis present

## 2016-02-24 MED ORDER — METHOCARBAMOL 500 MG PO TABS: 500.0000 mg | ORAL_TABLET | Freq: Two times a day (BID) | ORAL | 0 refills | Status: DC

## 2016-02-24 MED ORDER — PROMETHAZINE HCL 25 MG/ML IJ SOLN: 25.0000 mg | Freq: Once | INTRAMUSCULAR | Status: DC

## 2016-02-24 NOTE — ED Notes (Signed)
From Xray 
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
## 2016-02-24 NOTE — ED Notes (Signed)
To radiology

## 2019-07-15 ENCOUNTER — Other Ambulatory Visit (HOSPITAL_COMMUNITY): Payer: Self-pay | Admitting: Family Medicine

## 2019-07-15 DIAGNOSIS — M25562 Pain in left knee: Secondary | ICD-10-CM

## 2019-07-16 ENCOUNTER — Other Ambulatory Visit: Payer: Self-pay

## 2019-07-16 ENCOUNTER — Ambulatory Visit (HOSPITAL_COMMUNITY)
Admission: RE | Admit: 2019-07-16 | Discharge: 2019-07-16 | Disposition: A | Discharge status: Home / Self Care | Payer: BC Managed Care – PPO | Source: Ambulatory Visit | Attending: Family Medicine | Admitting: Family Medicine

## 2019-07-16 DIAGNOSIS — M25562 Pain in left knee: Secondary | ICD-10-CM | POA: Diagnosis not present

## 2021-02-28 DIAGNOSIS — Z841 Family history of disorders of kidney and ureter: Secondary | ICD-10-CM | POA: Diagnosis not present

## 2021-02-28 DIAGNOSIS — M545 Low back pain, unspecified: Secondary | ICD-10-CM | POA: Diagnosis not present

## 2021-02-28 DIAGNOSIS — N390 Urinary tract infection, site not specified: Secondary | ICD-10-CM | POA: Diagnosis not present

## 2021-02-28 DIAGNOSIS — L83 Acanthosis nigricans: Secondary | ICD-10-CM | POA: Diagnosis not present

## 2021-03-03 DIAGNOSIS — Z1231 Encounter for screening mammogram for malignant neoplasm of breast: Secondary | ICD-10-CM | POA: Diagnosis not present

## 2021-03-08 ENCOUNTER — Encounter: Payer: Self-pay | Admitting: Internal Medicine

## 2021-03-08 DIAGNOSIS — I10 Essential (primary) hypertension: Secondary | ICD-10-CM | POA: Diagnosis not present

## 2021-04-10 DIAGNOSIS — I959 Hypotension, unspecified: Secondary | ICD-10-CM | POA: Diagnosis not present

## 2021-04-10 DIAGNOSIS — Z79899 Other long term (current) drug therapy: Secondary | ICD-10-CM | POA: Diagnosis not present

## 2021-04-10 DIAGNOSIS — E039 Hypothyroidism, unspecified: Secondary | ICD-10-CM | POA: Diagnosis not present

## 2021-04-10 DIAGNOSIS — I10 Essential (primary) hypertension: Secondary | ICD-10-CM | POA: Diagnosis not present

## 2021-04-10 DIAGNOSIS — E119 Type 2 diabetes mellitus without complications: Secondary | ICD-10-CM | POA: Diagnosis not present

## 2021-04-10 DIAGNOSIS — R42 Dizziness and giddiness: Secondary | ICD-10-CM | POA: Diagnosis not present

## 2021-05-10 ENCOUNTER — Ambulatory Visit: Payer: Self-pay

## 2021-05-10 ENCOUNTER — Telehealth: Payer: Self-pay | Admitting: *Deleted

## 2021-05-10 ENCOUNTER — Other Ambulatory Visit: Payer: Self-pay

## 2021-05-10 ENCOUNTER — Encounter: Payer: Self-pay | Admitting: *Deleted

## 2021-05-10 NOTE — Telephone Encounter (Signed)
Tried to call pt for 10:00 nurse visit by phone x 2 times.  Left a voice message for pt. ?

## 2021-07-12 DIAGNOSIS — I10 Essential (primary) hypertension: Secondary | ICD-10-CM | POA: Diagnosis not present

## 2021-07-12 DIAGNOSIS — E039 Hypothyroidism, unspecified: Secondary | ICD-10-CM | POA: Diagnosis not present

## 2021-07-12 DIAGNOSIS — G4719 Other hypersomnia: Secondary | ICD-10-CM | POA: Diagnosis not present

## 2021-07-12 DIAGNOSIS — E119 Type 2 diabetes mellitus without complications: Secondary | ICD-10-CM | POA: Diagnosis not present

## 2021-07-25 IMAGING — DX DG KNEE COMPLETE 4+V*L*
4 series · 4 of 4 positions shown · non-contrast
Comparison: None

CLINICAL DATA: LEFT knee pain and swelling for 3-4 months, no known
injury, history of remote surgery

EXAM:
LEFT KNEE - COMPLETE 4+ VIEW

[knee ap]
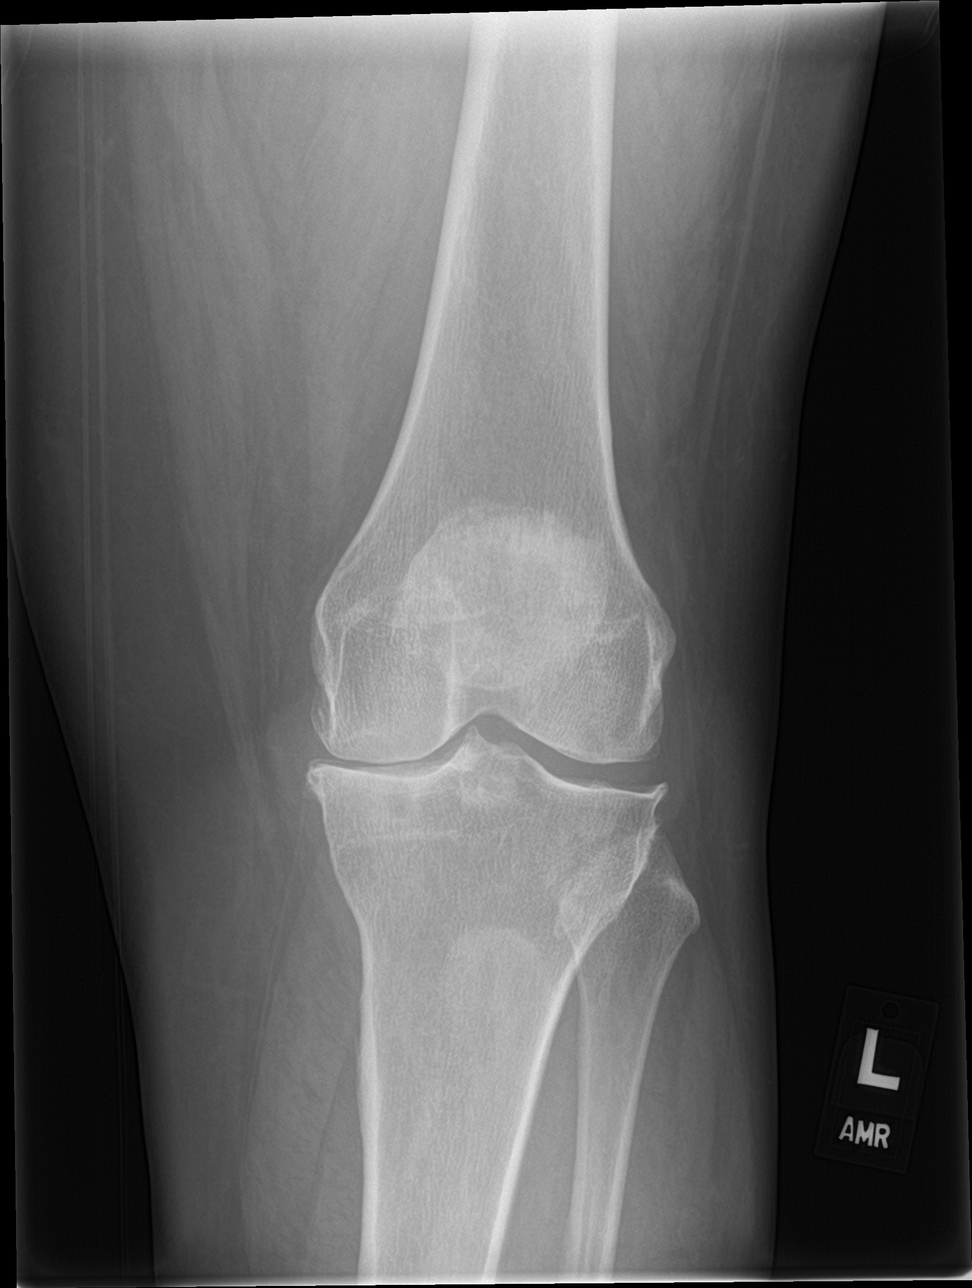

[knee obl (1 of 2)]
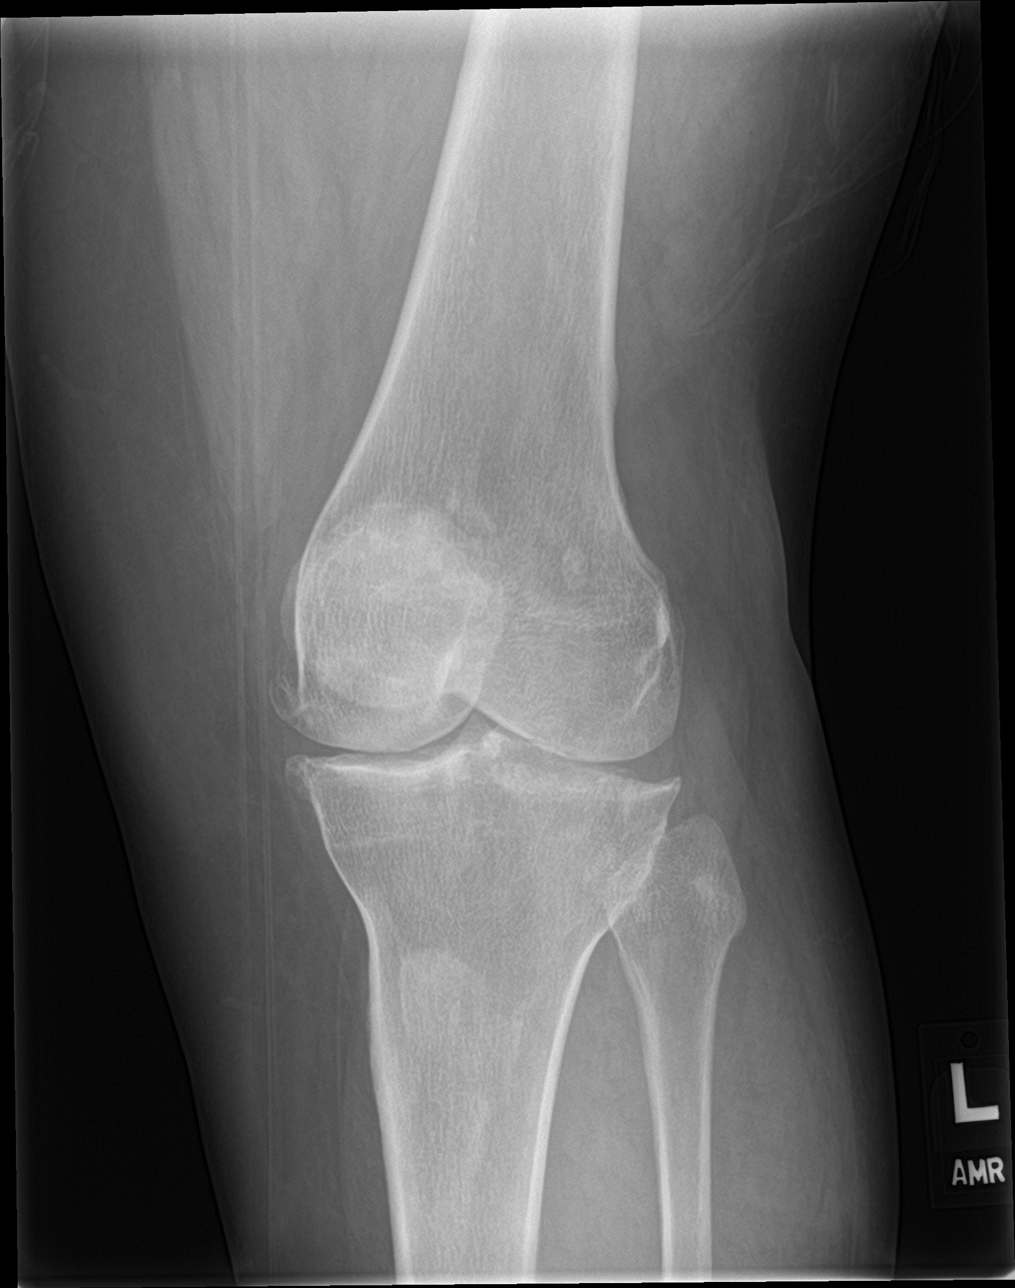

[knee obl (2 of 2)]
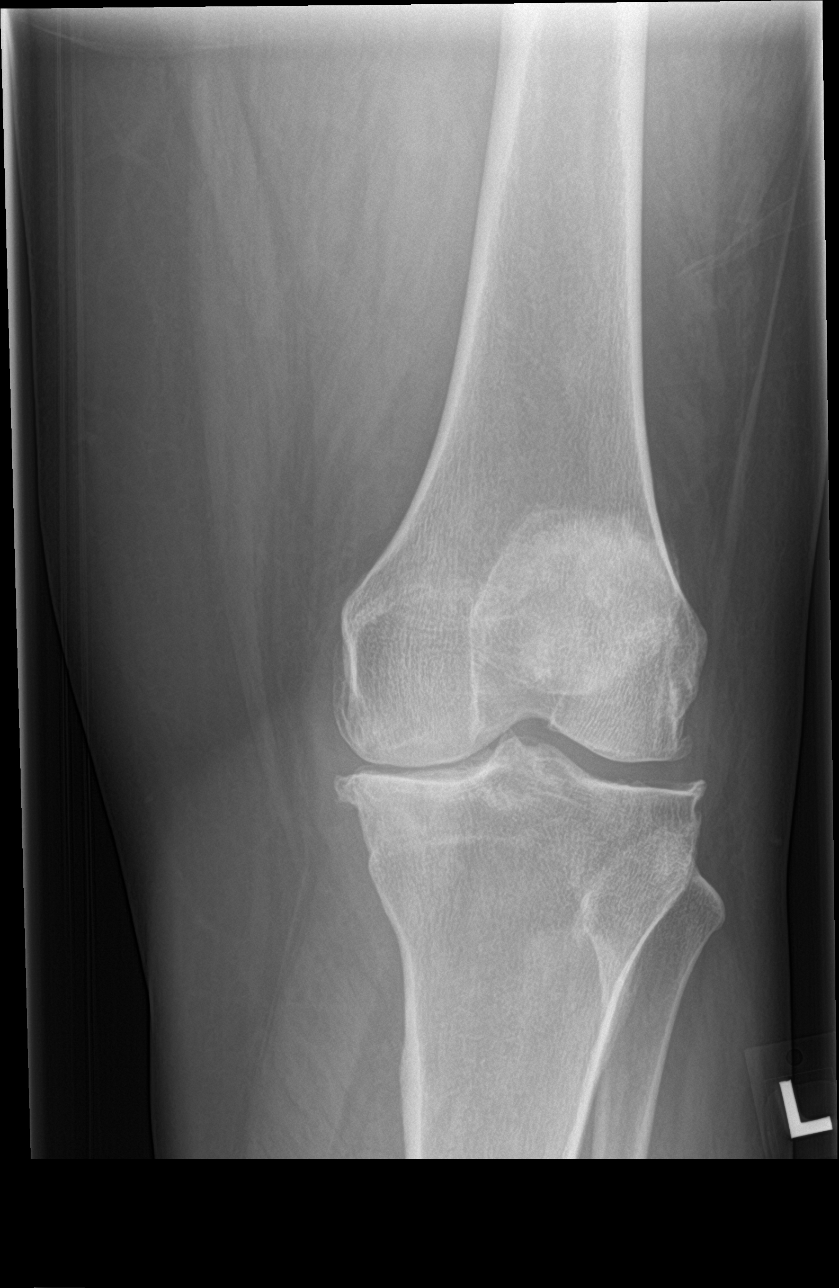

[knee lat]
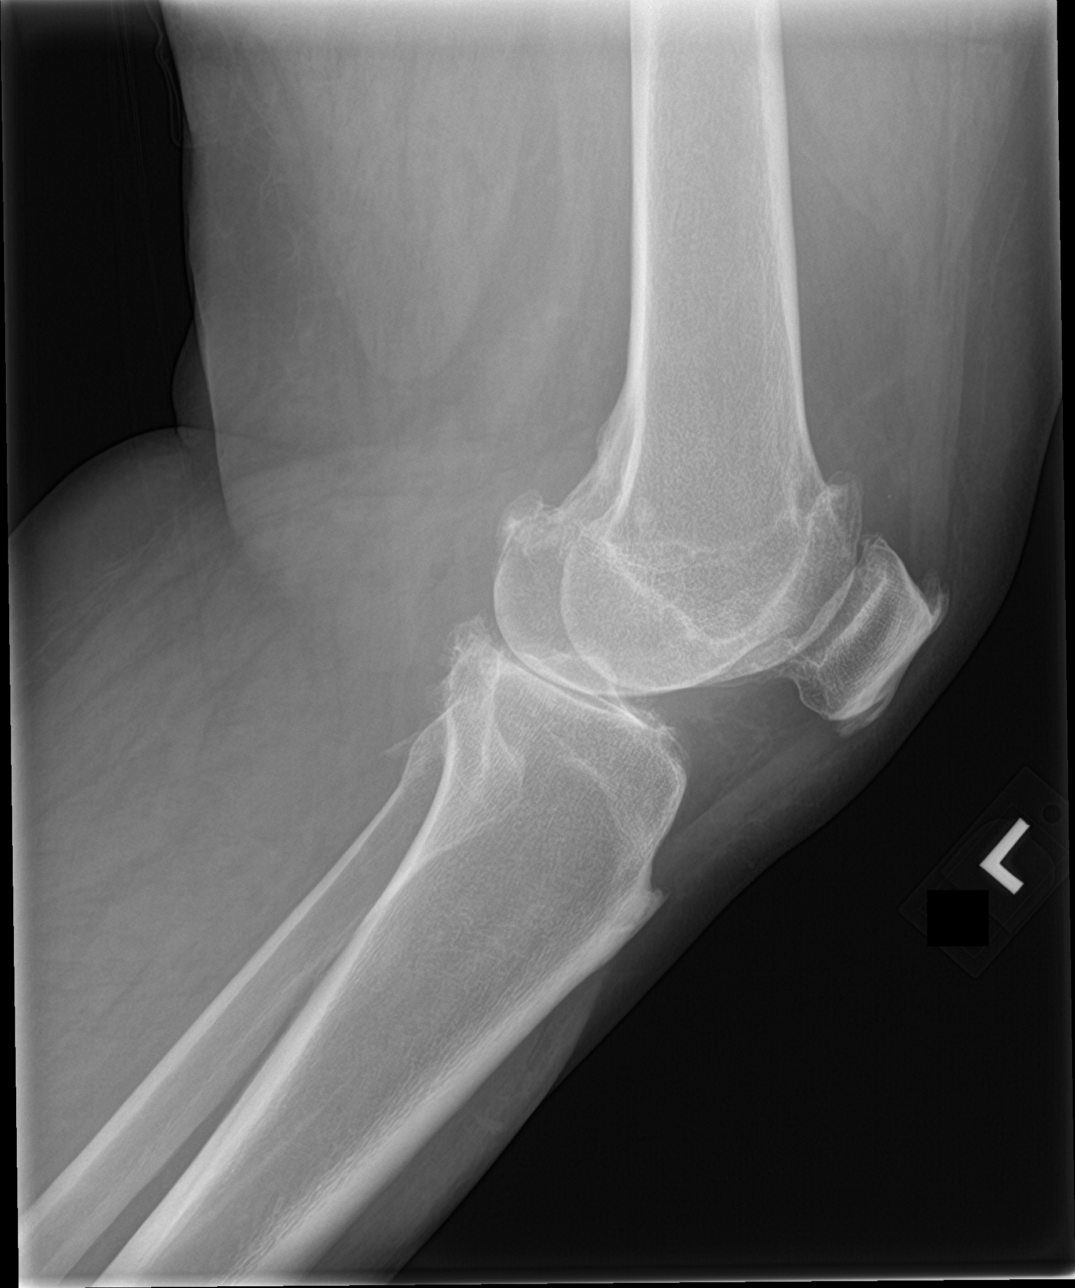

[4 of 4 positions shown; findings below may reference images not displayed]

FINDINGS: Osseous mineralization borderline decreased.

Tricompartmental osteoarthritic changes with joint space narrowing
and spur formation greatest at medial compartment.

No fracture, dislocation, or bone destruction.

No joint effusion.

Patellar spurring at quadriceps and patellar tendon insertions.
IMPRESSION: Tricompartmental osteoarthritic changes.

No acute abnormalities.

## 2022-03-09 DIAGNOSIS — R92323 Mammographic fibroglandular density, bilateral breasts: Secondary | ICD-10-CM | POA: Diagnosis not present

## 2022-03-09 DIAGNOSIS — Z1231 Encounter for screening mammogram for malignant neoplasm of breast: Secondary | ICD-10-CM | POA: Diagnosis not present

## 2022-05-29 ENCOUNTER — Other Ambulatory Visit (HOSPITAL_COMMUNITY): Payer: Self-pay | Admitting: Nurse Practitioner

## 2022-05-29 ENCOUNTER — Ambulatory Visit (HOSPITAL_COMMUNITY)
Admission: RE | Admit: 2022-05-29 | Discharge: 2022-05-29 | Disposition: A | Discharge status: Home / Self Care | Payer: BC Managed Care – PPO | Source: Ambulatory Visit | Attending: Nurse Practitioner | Admitting: Nurse Practitioner

## 2022-05-29 DIAGNOSIS — E559 Vitamin D deficiency, unspecified: Secondary | ICD-10-CM | POA: Diagnosis not present

## 2022-05-29 DIAGNOSIS — E039 Hypothyroidism, unspecified: Secondary | ICD-10-CM | POA: Diagnosis not present

## 2022-05-29 DIAGNOSIS — I10 Essential (primary) hypertension: Secondary | ICD-10-CM | POA: Diagnosis not present

## 2022-05-29 DIAGNOSIS — M25561 Pain in right knee: Secondary | ICD-10-CM | POA: Diagnosis not present

## 2022-05-29 DIAGNOSIS — E119 Type 2 diabetes mellitus without complications: Secondary | ICD-10-CM | POA: Diagnosis not present

## 2022-08-13 ENCOUNTER — Encounter: Payer: Self-pay | Admitting: Family Medicine

## 2022-08-13 ENCOUNTER — Ambulatory Visit: Payer: BC Managed Care – PPO | Admitting: Family Medicine

## 2022-08-13 VITALS — BP 129/61 | HR 69 | Ht 64.0 in | Wt 233.0 lb

## 2022-08-13 DIAGNOSIS — I10 Essential (primary) hypertension: Secondary | ICD-10-CM | POA: Diagnosis not present

## 2022-08-13 DIAGNOSIS — Z1159 Encounter for screening for other viral diseases: Secondary | ICD-10-CM

## 2022-08-13 DIAGNOSIS — Z13220 Encounter for screening for lipoid disorders: Secondary | ICD-10-CM

## 2022-08-13 DIAGNOSIS — E559 Vitamin D deficiency, unspecified: Secondary | ICD-10-CM | POA: Diagnosis not present

## 2022-08-13 DIAGNOSIS — Z114 Encounter for screening for human immunodeficiency virus [HIV]: Secondary | ICD-10-CM

## 2022-08-13 DIAGNOSIS — Z1231 Encounter for screening mammogram for malignant neoplasm of breast: Secondary | ICD-10-CM

## 2022-08-13 DIAGNOSIS — Z131 Encounter for screening for diabetes mellitus: Secondary | ICD-10-CM | POA: Diagnosis not present

## 2022-08-13 DIAGNOSIS — Z1329 Encounter for screening for other suspected endocrine disorder: Secondary | ICD-10-CM

## 2022-08-13 DIAGNOSIS — Z1211 Encounter for screening for malignant neoplasm of colon: Secondary | ICD-10-CM

## 2022-08-13 NOTE — Assessment & Plan Note (Signed)
Vitals:   08/13/22 1056 08/13/22 1115  BP: (!) 138/57 129/61  Continue chlorthalidone 12.5 mg daily  , Labs ordered in today' visit  Explained non pharmacological interventions such as low salt, DASH diet discussed. Educated on stress reduction and physical activity minimum 150 minutes per week. Discussed signs and symptoms of major cardiovascular event and need to present to the ED. Follow up in  3 months or sooner if needed. Patient verbalizes understanding regarding plan of care and all questions answered.

## 2022-08-13 NOTE — Patient Instructions (Signed)

## 2022-08-13 NOTE — Progress Notes (Signed)
New Patient Office Visit   Subjective   Patient ID: Hannah Potter, female    DOB: [birth year]  Age: 62 y.o. MRN: [id number]  CC:  Chief Complaint  Patient presents with   Establish Care    HPI Hannah Potter 62 year old female, presents to establish care. She  has a past medical history of Asthma, Bronchitis, Diabetes mellitus without complication (HCC), Hypertension, Sinus problem, and Thyroid disease.  Patient here for HTN management. She is exercising and is adherent to low salt diet.  Blood pressure is well controlled at home.  Patient denies Cardiac symptoms chest pain, chest pressure/discomfort, dyspnea, exertional chest pressure/discomfort, lower extremity edema, and tachypnea. Cardiovascular risk factors: dyslipidemia, hypertension, obesity (BMI >= 30 kg/m2), and sedentary lifestyle. Use of agents associated with hypertension:       Outpatient Encounter Medications as of 08/13/2022  Medication Sig   albuterol (PROAIR HFA) 108 (90 BASE) MCG/ACT inhaler Inhale 2 puffs into the lungs every 4 (four) hours as needed for shortness of breath.    aspirin 81 MG chewable tablet Chew 81 mg by mouth daily.   budesonide-formoterol (SYMBICORT) 160-4.5 MCG/ACT inhaler Inhale 2 puffs into the lungs 2 (two) times daily as needed (Shortness of Breath).    chlorthalidone (HYGROTON) 25 MG tablet Take 12.5 mg by mouth daily.   levothyroxine (SYNTHROID, LEVOTHROID) 150 MCG tablet Take 150 mcg by mouth daily.     loratadine (CLARITIN) 10 MG tablet Take 10 mg by mouth daily.   [DISCONTINUED] cetirizine (ZYRTEC) 10 MG tablet Take 10 mg by mouth daily.   [DISCONTINUED] amoxicillin (AMOXIL) 500 MG capsule Take 1 capsule (500 mg total) by mouth 3 (three) times daily.   [DISCONTINUED] hydrochlorothiazide (HYDRODIURIL) 25 MG tablet Take 25 mg by mouth daily.   [DISCONTINUED] methocarbamol (ROBAXIN) 500 MG tablet Take 1 tablet (500 mg total) by mouth 2 (two) times daily.   [DISCONTINUED]  Phenyleph-Doxyl-DM-Aspirin (ALKA-SELTZER PLUS DAY/NIGHT PO) Take 1 tablet by mouth every 4 (four) hours as needed (Cold Symptoms).   [DISCONTINUED] predniSONE (DELTASONE) 50 MG tablet Take one tablet daily starting 04/15/15.   No facility-administered encounter medications on file as of 08/13/2022.    Past Surgical History:  Procedure Laterality Date   ABDOMINAL HYSTERECTOMY     ABDOMINAL SURGERY     HERNIA REPAIR      Review of Systems  Constitutional:  Negative for chills and fever.  Eyes:  Negative for blurred vision.  Respiratory:  Negative for shortness of breath.   Cardiovascular:  Negative for chest pain.  Gastrointestinal:  Negative for abdominal pain.  Genitourinary:  Negative for dysuria.  Neurological:  Negative for dizziness.      Objective    BP 129/61   Pulse 69   Ht 5\' 4"  (1.626 m)   Wt 233 lb (105.7 kg)   LMP 02/12/1998   SpO2 93%   BMI 39.99 kg/m   Physical Exam Vitals reviewed.  Constitutional:      General: She is not in acute distress.    Appearance: Normal appearance. She is not ill-appearing, toxic-appearing or diaphoretic.  HENT:     Head: Normocephalic.  Eyes:     General:        Right eye: No discharge.        Left eye: No discharge.     Conjunctiva/sclera: Conjunctivae normal.  Cardiovascular:     Rate and Rhythm: Normal rate.     Pulses: Normal pulses.     Heart sounds: Normal  heart sounds.  Pulmonary:     Effort: Pulmonary effort is normal. No respiratory distress.     Breath sounds: Normal breath sounds.  Abdominal:     General: Bowel sounds are normal.     Palpations: Abdomen is soft.     Tenderness: There is no abdominal tenderness. There is no right CVA tenderness, left CVA tenderness or guarding.  Musculoskeletal:        General: Normal range of motion.     Cervical back: Normal range of motion.  Skin:    General: Skin is warm and dry.     Capillary Refill: Capillary refill takes less than 2 seconds.  Neurological:      General: No focal deficit present.     Mental Status: She is alert and oriented to person, place, and time.     Coordination: Coordination normal.     Gait: Gait normal.  Psychiatric:        Mood and Affect: Mood normal.        Behavior: Behavior normal.       Assessment & Plan:  Primary hypertension Assessment & Plan: Vitals:   08/13/22 1056 08/13/22 1115  BP: (!) 138/57 129/61  Continue chlorthalidone 12.5 mg daily  , Labs ordered in today' visit  Explained non pharmacological interventions such as low salt, DASH diet discussed. Educated on stress reduction and physical activity minimum 150 minutes per week. Discussed signs and symptoms of major cardiovascular event and need to present to the ED. Follow up in  3 months or sooner if needed. Patient verbalizes understanding regarding plan of care and all questions answered.   Orders: -     Microalbumin / creatinine urine ratio -     CMP14+EGFR -     CBC with Differential/Platelet  Encounter for screening mammogram for malignant neoplasm of breast  Screening for colon cancer -     Ambulatory referral to Gastroenterology  Screening for lipid disorders -     Lipid panel  Screening for diabetes mellitus -     Hemoglobin A1c  Screening for thyroid disorder -     TSH + free T4  Screening for HIV (human immunodeficiency virus) -     HIV Antibody (routine testing w rflx)  Vitamin D deficiency -     VITAMIN D 25 Hydroxy (Vit-D Deficiency, Fractures)  Need for hepatitis C screening test -     Hepatitis C antibody    Return in about 3 months (around 11/13/2022) for hypertension, chronic follow-up, Pap smear.   Cruzita Lederer Newman Nip, FNP

## 2022-08-14 ENCOUNTER — Other Ambulatory Visit: Payer: Self-pay | Admitting: Family Medicine

## 2022-08-14 DIAGNOSIS — Z1329 Encounter for screening for other suspected endocrine disorder: Secondary | ICD-10-CM

## 2022-08-14 LAB — MICROALBUMIN / CREATININE URINE RATIO

## 2022-08-14 LAB — CMP14+EGFR
AST: 17 IU/L (ref 0–40)
Alkaline Phosphatase: 66 IU/L (ref 44–121)
CO2: 29 mmol/L (ref 20–29)
Calcium: 9.2 mg/dL (ref 8.7–10.3)
Creatinine, Ser: 0.89 mg/dL (ref 0.57–1.00)
Potassium: 4.5 mmol/L (ref 3.5–5.2)
Total Protein: 6.8 g/dL (ref 6.0–8.5)

## 2022-08-14 LAB — CBC WITH DIFFERENTIAL/PLATELET
Hemoglobin: 13 g/dL (ref 11.1–15.9)
MCH: 29.4 pg (ref 26.6–33.0)
MCHC: 32.6 g/dL (ref 31.5–35.7)
MCV: 90 fL (ref 79–97)
Monocytes Absolute: 0.2 10*3/uL (ref 0.1–0.9)

## 2022-08-14 LAB — TSH+FREE T4: Free T4: 2.18 ng/dL — ABNORMAL HIGH (ref 0.82–1.77)

## 2022-08-14 MED ORDER — LEVOTHYROXINE SODIUM 75 MCG PO TABS: 75.0000 ug | ORAL_TABLET | Freq: Every day | ORAL | 1 refills | Status: DC

## 2022-08-14 MED ORDER — METFORMIN HCL 500 MG PO TABS: 500.0000 mg | ORAL_TABLET | Freq: Every day | ORAL | 3 refills | Status: DC

## 2022-08-14 NOTE — Progress Notes (Signed)
Please inform patient Thyroid panel abnormal, she will need to come back for blood work in 6 weeks, In the meantime I decrease her synthroid dosage to 75 mcg to be taken daily before breakfast Please follow up in 6 weeks for repeat blood work ONLY. You can come into the clinic Monday-Friday 8-11 am make sure you're fasting for labs.  Hemoglobin A1c 6.8 indicates type 2 diabetes , Metformin 500 mg once daily sent to pharmacy Repeat labs next office visit  It is important to follow a DASH diet which includes vegetables,fruits,whole grains, fat free or low fat diary,fish,poultry,beans,nuts and seeds,vegetable oils. Find an activity that you will enjoyandstart to be active at least 5 days a week for 30 minutes each day.

## 2022-08-15 ENCOUNTER — Encounter: Payer: Self-pay | Admitting: *Deleted

## 2022-08-15 LAB — CBC WITH DIFFERENTIAL/PLATELET
Basophils Absolute: 0 10*3/uL (ref 0.0–0.2)
Basos: 0 %
EOS (ABSOLUTE): 0.3 10*3/uL (ref 0.0–0.4)
Eos: 7 %
Hematocrit: 39.9 % (ref 34.0–46.6)
Immature Grans (Abs): 0 10*3/uL (ref 0.0–0.1)
Immature Granulocytes: 0 %
Lymphocytes Absolute: 1.9 10*3/uL (ref 0.7–3.1)
Lymphs: 48 %
Monocytes: 6 %
Neutrophils Absolute: 1.5 10*3/uL (ref 1.4–7.0)
Neutrophils: 39 %
Platelets: 224 10*3/uL (ref 150–450)
RBC: 4.42 x10E6/uL (ref 3.77–5.28)
RDW: 12.6 % (ref 11.7–15.4)
WBC: 3.9 10*3/uL (ref 3.4–10.8)

## 2022-08-15 LAB — CMP14+EGFR
ALT: 18 IU/L (ref 0–32)
Albumin: 4.1 g/dL (ref 3.9–4.9)
BUN/Creatinine Ratio: 22 (ref 12–28)
BUN: 20 mg/dL (ref 8–27)
Bilirubin Total: 0.2 mg/dL (ref 0.0–1.2)
Chloride: 101 mmol/L (ref 96–106)
Globulin, Total: 2.7 g/dL (ref 1.5–4.5)
Glucose: 94 mg/dL (ref 70–99)
Sodium: 142 mmol/L (ref 134–144)
eGFR: 74 mL/min/{1.73_m2} (ref >59)

## 2022-08-15 LAB — HEMOGLOBIN A1C
Est. average glucose Bld gHb Est-mCnc: 148 mg/dL
Hgb A1c MFr Bld: 6.8 % — ABNORMAL HIGH (ref 4.8–5.6)

## 2022-08-15 LAB — LIPID PANEL
Chol/HDL Ratio: 2.9 ratio (ref 0.0–4.4)
Cholesterol, Total: 147 mg/dL (ref 100–199)
HDL: 51 mg/dL (ref >39)
LDL Chol Calc (NIH): 78 mg/dL (ref 0–99)
Triglycerides: 97 mg/dL (ref 0–149)
VLDL Cholesterol Cal: 18 mg/dL (ref 5–40)

## 2022-08-15 LAB — HEPATITIS C ANTIBODY: Hep C Virus Ab: NONREACTIVE

## 2022-08-15 LAB — HIV ANTIBODY (ROUTINE TESTING W REFLEX): HIV Screen 4th Generation wRfx: NONREACTIVE

## 2022-08-15 LAB — TSH+FREE T4: TSH: 0.072 u[IU]/mL — ABNORMAL LOW (ref 0.450–4.500)

## 2022-08-15 LAB — VITAMIN D 25 HYDROXY (VIT D DEFICIENCY, FRACTURES): Vit D, 25-Hydroxy: 30.8 ng/mL (ref 30.0–100.0)

## 2022-10-17 DIAGNOSIS — Z1329 Encounter for screening for other suspected endocrine disorder: Secondary | ICD-10-CM | POA: Diagnosis not present

## 2022-10-18 ENCOUNTER — Other Ambulatory Visit: Payer: Self-pay | Admitting: Family Medicine

## 2022-10-18 DIAGNOSIS — E039 Hypothyroidism, unspecified: Secondary | ICD-10-CM

## 2022-10-18 MED ORDER — LEVOTHYROXINE SODIUM 112 MCG PO TABS: 112.0000 ug | ORAL_TABLET | Freq: Every day | ORAL | 3 refills | Status: DC

## 2022-10-18 NOTE — Progress Notes (Signed)
Please inform patient  Thyroid panel elevated, I increased her Synthroid 112 mcg once daily Next visit we will repeat labs  I advise Take medication on an Empty Stomach: It's best to take thyroid medication first thing in the morning, at least 30 to 60 minutes before eating breakfast. This helps ensure optimal absorption of the medication.

## 2022-10-22 ENCOUNTER — Telehealth: Payer: Self-pay | Admitting: Family Medicine

## 2022-10-22 NOTE — Telephone Encounter (Signed)
Called in requesting refill on Ibuprofen [phone]

## 2022-10-22 NOTE — Telephone Encounter (Signed)
Vm not set up, Ibuprofen not on active medication list, needs an ov to further discuss.

## 2022-11-01 ENCOUNTER — Other Ambulatory Visit: Payer: Self-pay | Admitting: Family Medicine

## 2022-11-13 ENCOUNTER — Ambulatory Visit: Payer: BC Managed Care – PPO | Admitting: Family Medicine

## 2022-11-13 ENCOUNTER — Encounter: Payer: Self-pay | Admitting: Family Medicine

## 2022-11-13 VITALS — BP 133/62 | HR 67 | Ht 64.0 in | Wt 228.0 lb

## 2022-11-13 DIAGNOSIS — Z13220 Encounter for screening for lipoid disorders: Secondary | ICD-10-CM

## 2022-11-13 DIAGNOSIS — I10 Essential (primary) hypertension: Secondary | ICD-10-CM | POA: Diagnosis not present

## 2022-11-13 DIAGNOSIS — E039 Hypothyroidism, unspecified: Secondary | ICD-10-CM

## 2022-11-13 DIAGNOSIS — Z23 Encounter for immunization: Secondary | ICD-10-CM | POA: Diagnosis not present

## 2022-11-13 DIAGNOSIS — E119 Type 2 diabetes mellitus without complications: Secondary | ICD-10-CM

## 2022-11-13 DIAGNOSIS — R52 Pain, unspecified: Secondary | ICD-10-CM | POA: Diagnosis not present

## 2022-11-13 DIAGNOSIS — Z7984 Long term (current) use of oral hypoglycemic drugs: Secondary | ICD-10-CM

## 2022-11-13 MED ORDER — DULOXETINE HCL 30 MG PO CPEP: 30.0000 mg | ORAL_CAPSULE | Freq: Every day | ORAL | 3 refills | Status: DC

## 2022-11-13 MED ORDER — IBUPROFEN 800 MG PO TABS: 800.0000 mg | ORAL_TABLET | Freq: Three times a day (TID) | ORAL | 0 refills | Status: DC | PRN

## 2022-11-13 MED ORDER — ALBUTEROL SULFATE HFA 108 (90 BASE) MCG/ACT IN AERS: 2.0000 | INHALATION_SPRAY | RESPIRATORY_TRACT | 6 refills | Status: DC | PRN

## 2022-11-13 MED ORDER — BUDESONIDE-FORMOTEROL FUMARATE 160-4.5 MCG/ACT IN AERO: 2.0000 | INHALATION_SPRAY | Freq: Two times a day (BID) | RESPIRATORY_TRACT | 3 refills | Status: DC | PRN

## 2022-11-13 MED ORDER — CHLORTHALIDONE 25 MG PO TABS: 12.5000 mg | ORAL_TABLET | Freq: Every day | ORAL | 1 refills | Status: DC

## 2022-11-13 MED ORDER — LORATADINE 10 MG PO TABS: 10.0000 mg | ORAL_TABLET | Freq: Every day | ORAL | 3 refills | Status: AC

## 2022-11-13 NOTE — Progress Notes (Unsigned)
Patient Office Visit   Subjective   Patient ID: Hannah Potter, female    DOB: Apr 15, 1960  Age: 62 y.o. MRN: [id number]  CC:  Chief Complaint  Patient presents with   Follow-up    HPI Hannah Potter 62 year old female, presents to the clinic for chronic follow up.  She  has a past medical history of Asthma, Bronchitis, Diabetes mellitus without complication (HCC), Hypertension, Sinus problem, and Thyroid disease.  Diabetes She presents for her follow-up diabetic visit. She has type 2 diabetes mellitus. Pertinent negatives for hypoglycemia include no confusion, dizziness, nervousness/anxiousness or sweats. Pertinent negatives for diabetes include no blurred vision, no chest pain, no foot ulcerations, no polydipsia and no polyphagia. Pertinent negatives for hypoglycemia complications include no blackouts. Pertinent negatives for diabetic complications include no peripheral neuropathy. Risk factors for coronary artery disease include hypertension, sedentary lifestyle and obesity. Current diabetic treatment includes oral agent (monotherapy). She is compliant with treatment all of the time. She is following a generally healthy diet. Meal planning includes avoidance of concentrated sweets and carbohydrate counting. She has not had a previous visit with a dietitian. She participates in exercise intermittently. She does not see a podiatrist.Eye exam is not current.  Hypertension This is a chronic problem. The problem has been gradually improving since onset. Pertinent negatives include no blurred vision, chest pain, palpitations, peripheral edema, shortness of breath or sweats. Risk factors for coronary artery disease include diabetes mellitus, obesity and sedentary lifestyle. Past treatments include diuretics. The current treatment provides moderate improvement. Compliance problems include exercise.  There is no history of kidney disease. There is no history of chronic renal disease.       Outpatient Encounter Medications as of 11/13/2022  Medication Sig   aspirin 81 MG chewable tablet Chew 81 mg by mouth daily.   DULoxetine (CYMBALTA) 30 MG capsule Take 1 capsule (30 mg total) by mouth daily.   ibuprofen (ADVIL) 800 MG tablet Take 1 tablet (800 mg total) by mouth every 8 (eight) hours as needed.   levothyroxine (SYNTHROID) 112 MCG tablet Take 1 tablet (112 mcg total) by mouth daily.   metFORMIN (GLUCOPHAGE) 500 MG tablet Take 1 tablet (500 mg total) by mouth daily with breakfast.   [DISCONTINUED] albuterol (PROAIR HFA) 108 (90 BASE) MCG/ACT inhaler Inhale 2 puffs into the lungs every 4 (four) hours as needed for shortness of breath.    [DISCONTINUED] budesonide-formoterol (SYMBICORT) 160-4.5 MCG/ACT inhaler Inhale 2 puffs into the lungs 2 (two) times daily as needed (Shortness of Breath).    [DISCONTINUED] chlorthalidone (HYGROTON) 25 MG tablet Take 12.5 mg by mouth daily.   [DISCONTINUED] loratadine (CLARITIN) 10 MG tablet Take 10 mg by mouth daily.   albuterol (PROAIR HFA) 108 (90 Base) MCG/ACT inhaler Inhale 2 puffs into the lungs every 4 (four) hours as needed for shortness of breath.   budesonide-formoterol (SYMBICORT) 160-4.5 MCG/ACT inhaler Inhale 2 puffs into the lungs 2 (two) times daily as needed (Shortness of Breath).   chlorthalidone (HYGROTON) 25 MG tablet Take 0.5 tablets (12.5 mg total) by mouth daily.   loratadine (CLARITIN) 10 MG tablet Take 1 tablet (10 mg total) by mouth daily.   No facility-administered encounter medications on file as of 11/13/2022.    Past Surgical History:  Procedure Laterality Date   ABDOMINAL HYSTERECTOMY     ABDOMINAL SURGERY     HERNIA REPAIR      Review of Systems  Constitutional:  Negative for chills and fever.  Eyes:  Negative for blurred vision.  Respiratory:  Negative for shortness of breath.   Cardiovascular:  Negative for chest pain and palpitations.  Gastrointestinal:  Negative for abdominal pain.   Genitourinary:  Negative for dysuria and flank pain.  Musculoskeletal:  Positive for myalgias.  Neurological:  Negative for dizziness.  Endo/Heme/Allergies:  Negative for polydipsia and polyphagia.  Psychiatric/Behavioral:  Negative for confusion and depression. The patient is not nervous/anxious.       Objective    BP 133/62   Pulse 67   Ht 5\' 4"  (1.626 m)   Wt 228 lb (103.4 kg)   LMP 02/12/1998   SpO2 95%   BMI 39.14 kg/m   Physical Exam Vitals reviewed.  Constitutional:      General: She is not in acute distress.    Appearance: Normal appearance. She is not ill-appearing, toxic-appearing or diaphoretic.  HENT:     Head: Normocephalic.  Eyes:     General:        Right eye: No discharge.        Left eye: No discharge.     Conjunctiva/sclera: Conjunctivae normal.  Cardiovascular:     Rate and Rhythm: Normal rate.     Pulses: Normal pulses.     Heart sounds: Normal heart sounds.  Pulmonary:     Effort: Pulmonary effort is normal. No respiratory distress.     Breath sounds: Normal breath sounds.  Musculoskeletal:        General: Normal range of motion.     Cervical back: Normal range of motion.  Skin:    General: Skin is warm and dry.     Capillary Refill: Capillary refill takes less than 2 seconds.  Neurological:     General: No focal deficit present.     Mental Status: She is alert and oriented to person, place, and time.     Coordination: Coordination normal.     Gait: Gait normal.  Psychiatric:        Mood and Affect: Mood normal.       Assessment & Plan:  Type 2 diabetes mellitus without complication, without long-term current use of insulin (HCC) Assessment & Plan: Last Hemoglobin A1C 6.8, Labs ordered today awaiting results will follow up. Patient reports  taking  Metformin 500 mg once daily. Discussed  Nonpharmacological interventions such as low carb diet,high in protein, vegetables and fruit discussed. Educated on importance of physical activity 150  minutes per week. Discussed signs and symptoms of hypoglycemia, & hyperglycemia and need to present to the ED if symptoms occurs.Follow up in 3 months or sooner if needed. Patient verbalizes understanding regarding plan of care and all questions answered. Ophthalmology referral placed , Foot exam within desired limits   Orders: -     Hemoglobin A1c -     Ambulatory referral to Ophthalmology  Need for Tdap vaccination -     Tdap vaccine greater than or equal to 7yo IM  Need for shingles vaccine -     Varicella-zoster vaccine IM  Diffuse pain -     Rheumatoid factor  Primary hypertension Assessment & Plan: Vitals:   11/13/22 1057  BP: 133/62   Blood pressure controlled in today's visit Continue chlorthalidone 12.5 mg daily, Labs ordered Continued discussion on DASH diet, low sodium diet and maintain a exercise routine for 150 minutes per week.   Orders: -     BMP8+eGFR -     CBC with Differential/Platelet  Hypothyroidism, unspecified type -     TSH +  free T4 -     Thyroid peroxidase antibody  Screening for lipid disorders -     Lipid panel  Other orders -     Ibuprofen; Take 1 tablet (800 mg total) by mouth every 8 (eight) hours as needed.  Dispense: 30 tablet; Refill: 0 -     DULoxetine HCl; Take 1 capsule (30 mg total) by mouth daily.  Dispense: 30 capsule; Refill: 3 -     Albuterol Sulfate HFA; Inhale 2 puffs into the lungs every 4 (four) hours as needed for shortness of breath.  Dispense: 8 g; Refill: 6 -     Budesonide-Formoterol Fumarate; Inhale 2 puffs into the lungs 2 (two) times daily as needed (Shortness of Breath).  Dispense: 1 each; Refill: 3 -     Chlorthalidone; Take 0.5 tablets (12.5 mg total) by mouth daily.  Dispense: 90 tablet; Refill: 1 -     Loratadine; Take 1 tablet (10 mg total) by mouth daily.  Dispense: 30 tablet; Refill: 3    Return in about 4 months (around 03/16/2023), or if symptoms worsen or fail to improve, for type 2 diabetes, thyroid,  hypertension.   Cruzita Lederer Newman Nip, FNP

## 2022-11-13 NOTE — Patient Instructions (Signed)

## 2022-11-14 ENCOUNTER — Other Ambulatory Visit: Payer: Self-pay | Admitting: Family Medicine

## 2022-11-14 DIAGNOSIS — E039 Hypothyroidism, unspecified: Secondary | ICD-10-CM

## 2022-11-14 DIAGNOSIS — E119 Type 2 diabetes mellitus without complications: Secondary | ICD-10-CM | POA: Insufficient documentation

## 2022-11-14 LAB — CBC WITH DIFFERENTIAL/PLATELET
Basophils Absolute: 0 10*3/uL (ref 0.0–0.2)
Basos: 0 %
EOS (ABSOLUTE): 0.2 10*3/uL (ref 0.0–0.4)
Eos: 5 %
Hematocrit: 41.9 % (ref 34.0–46.6)
Hemoglobin: 13.3 g/dL (ref 11.1–15.9)
Immature Grans (Abs): 0 10*3/uL (ref 0.0–0.1)
Immature Granulocytes: 0 %
Lymphocytes Absolute: 2.3 10*3/uL (ref 0.7–3.1)
Lymphs: 53 %
MCH: 30 pg (ref 26.6–33.0)
MCHC: 31.7 g/dL (ref 31.5–35.7)
MCV: 94 fL (ref 79–97)
Monocytes Absolute: 0.2 10*3/uL (ref 0.1–0.9)
Monocytes: 5 %
Neutrophils Absolute: 1.7 10*3/uL (ref 1.4–7.0)
Neutrophils: 37 %
Platelets: 270 10*3/uL (ref 150–450)
RBC: 4.44 x10E6/uL (ref 3.77–5.28)
RDW: 13.5 % (ref 11.7–15.4)
WBC: 4.5 10*3/uL (ref 3.4–10.8)

## 2022-11-14 LAB — LIPID PANEL
Chol/HDL Ratio: 2.9 {ratio} (ref 0.0–4.4)
Cholesterol, Total: 178 mg/dL (ref 100–199)
HDL: 62 mg/dL (ref >39)
LDL Chol Calc (NIH): 95 mg/dL (ref 0–99)
Triglycerides: 117 mg/dL (ref 0–149)
VLDL Cholesterol Cal: 21 mg/dL (ref 5–40)

## 2022-11-14 LAB — BMP8+EGFR
BUN/Creatinine Ratio: 17 (ref 12–28)
BUN: 18 mg/dL (ref 8–27)
CO2: 30 mmol/L — ABNORMAL HIGH (ref 20–29)
Calcium: 9.6 mg/dL (ref 8.7–10.3)
Chloride: 100 mmol/L (ref 96–106)
Creatinine, Ser: 1.05 mg/dL — ABNORMAL HIGH (ref 0.57–1.00)
Glucose: 94 mg/dL (ref 70–99)
Potassium: 3.9 mmol/L (ref 3.5–5.2)
Sodium: 145 mmol/L — ABNORMAL HIGH (ref 134–144)
eGFR: 60 mL/min/{1.73_m2} (ref >59)

## 2022-11-14 LAB — THYROID PEROXIDASE ANTIBODY: Thyroperoxidase Ab SerPl-aCnc: <9 [IU]/mL (ref 0–34)

## 2022-11-14 LAB — HEMOGLOBIN A1C
Est. average glucose Bld gHb Est-mCnc: 137 mg/dL
Hgb A1c MFr Bld: 6.4 % — ABNORMAL HIGH (ref 4.8–5.6)

## 2022-11-14 LAB — RHEUMATOID FACTOR: Rheumatoid fact SerPl-aCnc: 10.4 [IU]/mL (ref <14.0)

## 2022-11-14 LAB — TSH+FREE T4
Free T4: 0.81 ng/dL — ABNORMAL LOW (ref 0.82–1.77)
TSH: 25.2 u[IU]/mL — ABNORMAL HIGH (ref 0.450–4.500)

## 2022-11-14 MED ORDER — LEVOTHYROXINE SODIUM 125 MCG PO TABS: 125.0000 ug | ORAL_TABLET | Freq: Every day | ORAL | 3 refills | Status: DC

## 2022-11-14 NOTE — Progress Notes (Signed)
Please inform patient, Hemoglobin A1c level decrease from prior reading Good Job! Continue Metformin 500 mg once daily.  It is important to follow a DASH diet which includes vegetables,fruits,whole grains, fat free or low fat diary,fish,poultry,beans,nuts and seeds,vegetable oils. Find an activity that you will enjoyandstart to be active at least 5 days a week for 30 minutes each day.    Thyroid panel still elevated Referral placed to endocrinology for thyroid management.  Please take synthroid 125 mcg medication every morning on a empty stomach before breakfast.

## 2022-11-14 NOTE — Assessment & Plan Note (Signed)
Vitals:   11/13/22 1057  BP: 133/62   Blood pressure controlled in today's visit Continue chlorthalidone 12.5 mg daily, Labs ordered Continued discussion on DASH diet, low sodium diet and maintain a exercise routine for 150 minutes per week.

## 2022-11-14 NOTE — Assessment & Plan Note (Signed)
Last Hemoglobin A1C 6.8, Labs ordered today awaiting results will follow up. Patient reports  taking  Metformin 500 mg once daily. Discussed  Nonpharmacological interventions such as low carb diet,high in protein, vegetables and fruit discussed. Educated on importance of physical activity 150 minutes per week. Discussed signs and symptoms of hypoglycemia, & hyperglycemia and need to present to the ED if symptoms occurs.Follow up in 3 months or sooner if needed. Patient verbalizes understanding regarding plan of care and all questions answered. Ophthalmology referral placed , Foot exam within desired limits

## 2022-11-16 ENCOUNTER — Other Ambulatory Visit: Payer: Self-pay | Admitting: Family Medicine

## 2022-11-16 DIAGNOSIS — Z1212 Encounter for screening for malignant neoplasm of rectum: Secondary | ICD-10-CM

## 2022-11-16 DIAGNOSIS — Z1211 Encounter for screening for malignant neoplasm of colon: Secondary | ICD-10-CM

## 2023-01-07 ENCOUNTER — Telehealth: Payer: Self-pay | Admitting: Family Medicine

## 2023-01-07 NOTE — Telephone Encounter (Signed)
Pt came by office has not received [employer]og[employer] Kit in mail    Wants a call back in reg ard

## 2023-01-08 NOTE — Telephone Encounter (Signed)
Pt states she has got in touch with [employer] directly they are going to be mailing a new kit out.

## 2023-02-21 ENCOUNTER — Encounter: Payer: Self-pay | Admitting: *Deleted

## 2023-03-06 ENCOUNTER — Telehealth: Payer: Self-pay | Admitting: Family Medicine

## 2023-03-06 NOTE — Telephone Encounter (Signed)
Copied from CRM [id number]. Topic: General - Other >> Mar 06, 2023 11:22 AM Geroge Baseman wrote: Reason for CRM: [facility] called and stated that Hannah Potter does not populate as a provider for this patients breast exam, so she stated she would use dr simpson as attending md for Hannah Potter so it will not get denied.

## 2023-03-13 DIAGNOSIS — Z1231 Encounter for screening mammogram for malignant neoplasm of breast: Secondary | ICD-10-CM | POA: Diagnosis not present

## 2023-03-13 DIAGNOSIS — R92313 Mammographic fatty tissue density, bilateral breasts: Secondary | ICD-10-CM | POA: Diagnosis not present

## 2023-03-13 LAB — HM MAMMOGRAPHY

## 2023-03-15 ENCOUNTER — Encounter: Payer: Self-pay | Admitting: Family Medicine

## 2023-03-18 ENCOUNTER — Ambulatory Visit: Payer: BC Managed Care – PPO | Admitting: Family Medicine

## 2023-03-18 NOTE — Patient Instructions (Signed)

## 2023-03-18 NOTE — Progress Notes (Signed)
 pred  Established Patient Office Visit   Subjective  Patient ID: Hannah Potter, female    DOB: [birth year]  Age: 63 y.o. MRN: [id number]  Chief Complaint  Patient presents with   Follow-up    4 month f/u for type 2 diabetes, thyroid , hypertension. Questions about Large red bruise on side of rt foot. That causes swelling     She  has a past medical history of Asthma, Bronchitis, Diabetes mellitus without complication (HCC), Hypertension, Sinus problem, and Thyroid  disease.  HPI Patient presents to the clinic for chronic follow up. For the details of today's visit, please refer to assessment and plan.   Review of Systems  Constitutional:  Negative for chills and fever.  Eyes:  Negative for blurred vision.  Respiratory:  Negative for cough and shortness of breath.   Cardiovascular:  Negative for chest pain.  Gastrointestinal:  Negative for abdominal pain.  Musculoskeletal:  Positive for joint pain and myalgias.  Neurological:  Negative for dizziness and headaches.      Objective:     BP 122/70   Pulse 70   Ht 5' 4 (1.626 m)   Wt 217 lb 1.3 oz (98.5 kg)   LMP 02/12/1998   SpO2 95%   BMI 37.26 kg/m  BP Readings from Last 3 Encounters:  03/20/23 122/70  11/13/22 133/62  08/13/22 129/61      Physical Exam Vitals reviewed.  Constitutional:      General: She is not in acute distress.    Appearance: Normal appearance. She is not ill-appearing, toxic-appearing or diaphoretic.  HENT:     Head: Normocephalic.  Eyes:     General:        Right eye: No discharge.        Left eye: No discharge.     Conjunctiva/sclera: Conjunctivae normal.  Cardiovascular:     Rate and Rhythm: Normal rate.     Pulses: Normal pulses.     Heart sounds: Normal heart sounds.  Pulmonary:     Effort: Pulmonary effort is normal. No respiratory distress.     Breath sounds: Normal breath sounds.  Abdominal:     General: Bowel sounds are normal.     Palpations: Abdomen is soft.      Tenderness: There is no abdominal tenderness. There is no right CVA tenderness, left CVA tenderness or guarding.  Musculoskeletal:        General: Normal range of motion.     Cervical back: Normal range of motion.     Right lower leg: No edema.     Left lower leg: No edema.  Skin:    General: Skin is warm and dry.     Capillary Refill: Capillary refill takes less than 2 seconds.  Neurological:     Mental Status: She is alert.  Psychiatric:        Mood and Affect: Mood normal.        Behavior: Behavior normal.      No results found for any visits on 03/20/23.  The 10-year ASCVD risk score (Arnett DK, et al., 2019) is: 13.3%    Assessment & Plan:  Type 2 diabetes mellitus without complication, without long-term current use of insulin (HCC) Assessment & Plan: Last Hemoglobin A1c: 6.4 Labs: Ordered today, results pending; will follow up accordingly. The patient reports adhering to prescribed medications: Metformin  500 mg once daily  Reviewed non-pharmacological interventions, including a balanced diet rich in lean proteins, healthy fats, whole grains, and high-fiber vegetables. Emphasized reducing  refined sugars and processed carbohydrates, and incorporating more fruits, leafy greens, and legumes. Education: Patient was educated on recognizing signs and symptoms of both hypoglycemia and hyperglycemia, and advised to seek emergency care if these symptoms occur. Follow-Up: Scheduled for follow-up in 3-4 months, or sooner if needed. Patient Understanding: The patient verbalized understanding of the care plan, and all questions were answered. Additional Care: Ophthalmology referral was placed. Foot exam results were within normal limits.   Orders: -     Hemoglobin A1c -     Ambulatory referral to Ophthalmology  TSH (thyroid -stimulating hormone deficiency) -     TSH + free T4  Primary hypertension Assessment & Plan: Vitals:   03/20/23 1030  BP: 122/70  Continue chlorthalidone   12.5 mg daily,  Labs ordered. Discussed with  patient to monitor their blood pressure regularly and maintain a heart-healthy diet rich in fruits, vegetables, whole grains, and low-fat dairy, while reducing sodium intake to less than 2,300 mg per day. Regular physical activity, such as 30 minutes of moderate exercise most days of the week, will help lower blood pressure and improve overall cardiovascular health. Avoiding smoking, limiting alcohol consumption, and managing stress. Take  prescribed medication, & take it as directed and avoid skipping doses. Seek emergency care if your blood pressure is (over 180/100) or you experience chest pain, shortness of breath, or sudden vision changes.Patient verbalizes understanding regarding plan of care and all questions answered.   Orders: -     BMP8+eGFR -     CBC with Differential/Platelet -     Lipid panel  Hypothyroidism, unspecified type Assessment & Plan: Previous TSH levels elevated  Repeat Panel done today Patient reports taking synthroid  125 mcg once daily Advise Take it on an empty stomach: Take your Synthroid  first thing in the morning, at least 30-60 minutes before eating breakfast or drinking coffee. Food and certain drinks can affect how well your body absorbs the medication. Use water only: Swallow the pill with a full glass of water to ensure it dissolves properly. Be consistent with timing: Take Synthroid  at the same time every day to maintain steady hormone levels in your body.  Avoid other medications or supplements around the same time: Do not take calcium, iron, antacids, or multivitamins within 4 hours of Synthroid , as these can interfere with absorption.   Orders: -     Ambulatory referral to Endocrinology  Screening for colon cancer -     Cologuard  Right foot pain Assessment & Plan: Trial on Prednisone  20 mg twice daily x 5 days Advise icing, stretching and modifying activities that cause pain. Possible physical  therapy referral, shoe inserts and NSAIDs for pain management. Discussed theThe RICE method: R: Rest the painful area for a few days. I: Ice the area for 20 minutes at a time to relieve inflammation. C: Compress the area with a soft wrap to reduce swelling. E: Elevate the area by putting the foot on a few pillows.  Follow up if pain still persists.    Other orders -     predniSONE ; Take 1 tablet (20 mg total) by mouth 2 (two) times daily with a meal for 5 days.  Dispense: 10 tablet; Refill: 0 -     DULoxetine  HCl; Take 1 capsule (30 mg total) by mouth daily.  Dispense: 30 capsule; Refill: 3    Return in about 2 months (around 05/18/2023), or if symptoms worsen or fail to improve, for 2 months for Pap smear AND 6 month chronic  follow up.   Hilario Kidd Wilhelmena Falter, FNP

## 2023-03-20 ENCOUNTER — Ambulatory Visit: Payer: BC Managed Care – PPO | Admitting: Family Medicine

## 2023-03-20 ENCOUNTER — Encounter: Payer: Self-pay | Admitting: Family Medicine

## 2023-03-20 VITALS — BP 122/70 | HR 70 | Ht 64.0 in | Wt 217.1 lb

## 2023-03-20 DIAGNOSIS — I10 Essential (primary) hypertension: Secondary | ICD-10-CM

## 2023-03-20 DIAGNOSIS — Z7984 Long term (current) use of oral hypoglycemic drugs: Secondary | ICD-10-CM

## 2023-03-20 DIAGNOSIS — M79671 Pain in right foot: Secondary | ICD-10-CM | POA: Insufficient documentation

## 2023-03-20 DIAGNOSIS — E1169 Type 2 diabetes mellitus with other specified complication: Secondary | ICD-10-CM | POA: Diagnosis not present

## 2023-03-20 DIAGNOSIS — E038 Other specified hypothyroidism: Secondary | ICD-10-CM | POA: Diagnosis not present

## 2023-03-20 DIAGNOSIS — E039 Hypothyroidism, unspecified: Secondary | ICD-10-CM | POA: Insufficient documentation

## 2023-03-20 DIAGNOSIS — E119 Type 2 diabetes mellitus without complications: Secondary | ICD-10-CM | POA: Diagnosis not present

## 2023-03-20 DIAGNOSIS — Z1211 Encounter for screening for malignant neoplasm of colon: Secondary | ICD-10-CM

## 2023-03-20 MED ORDER — DULOXETINE HCL 30 MG PO CPEP: 30.0000 mg | ORAL_CAPSULE | Freq: Every day | ORAL | 3 refills | Status: DC

## 2023-03-20 MED ORDER — PREDNISONE 20 MG PO TABS: 20.0000 mg | ORAL_TABLET | Freq: Two times a day (BID) | ORAL | 0 refills | Status: AC

## 2023-03-20 NOTE — Assessment & Plan Note (Signed)
 Last Hemoglobin A1c: 6.4 Labs: Ordered today, results pending; will follow up accordingly. The patient reports adhering to prescribed medications: Metformin  500 mg once daily  Reviewed non-pharmacological interventions, including a balanced diet rich in lean proteins, healthy fats, whole grains, and high-fiber vegetables. Emphasized reducing refined sugars and processed carbohydrates, and incorporating more fruits, leafy greens, and legumes. Education: Patient was educated on recognizing signs and symptoms of both hypoglycemia and hyperglycemia, and advised to seek emergency care if these symptoms occur. Follow-Up: Scheduled for follow-up in 3-4 months, or sooner if needed. Patient Understanding: The patient verbalized understanding of the care plan, and all questions were answered. Additional Care: Ophthalmology referral was placed. Foot exam results were within normal limits.

## 2023-03-20 NOTE — Assessment & Plan Note (Signed)
 Previous TSH levels elevated  Repeat Panel done today Patient reports taking synthroid  125 mcg once daily Advise Take it on an empty stomach: Take your Synthroid  first thing in the morning, at least 30-60 minutes before eating breakfast or drinking coffee. Food and certain drinks can affect how well your body absorbs the medication. Use water only: Swallow the pill with a full glass of water to ensure it dissolves properly. Be consistent with timing: Take Synthroid  at the same time every day to maintain steady hormone levels in your body.  Avoid other medications or supplements around the same time: Do not take calcium, iron, antacids, or multivitamins within 4 hours of Synthroid , as these can interfere with absorption.

## 2023-03-20 NOTE — Assessment & Plan Note (Signed)
 Trial on Prednisone  20 mg twice daily x 5 days Advise icing, stretching and modifying activities that cause pain. Possible physical therapy referral, shoe inserts and NSAIDs for pain management. Discussed theThe RICE method: R: Rest the painful area for a few days. I: Ice the area for 20 minutes at a time to relieve inflammation. C: Compress the area with a soft wrap to reduce swelling. E: Elevate the area by putting the foot on a few pillows.  Follow up if pain still persists.

## 2023-03-20 NOTE — Assessment & Plan Note (Signed)
 Vitals:   03/20/23 1030  BP: 122/70  Continue chlorthalidone  12.5 mg daily,  Labs ordered. Discussed with  patient to monitor their blood pressure regularly and maintain a heart-healthy diet rich in fruits, vegetables, whole grains, and low-fat dairy, while reducing sodium intake to less than 2,300 mg per day. Regular physical activity, such as 30 minutes of moderate exercise most days of the week, will help lower blood pressure and improve overall cardiovascular health. Avoiding smoking, limiting alcohol consumption, and managing stress. Take  prescribed medication, & take it as directed and avoid skipping doses. Seek emergency care if your blood pressure is (over 180/100) or you experience chest pain, shortness of breath, or sudden vision changes.Patient verbalizes understanding regarding plan of care and all questions answered.

## 2023-03-21 ENCOUNTER — Other Ambulatory Visit: Payer: Self-pay | Admitting: Family Medicine

## 2023-03-21 LAB — CBC WITH DIFFERENTIAL/PLATELET
Basophils Absolute: 0 10*3/uL (ref 0.0–0.2)
Basos: 1 %
EOS (ABSOLUTE): 0.3 10*3/uL (ref 0.0–0.4)
Eos: 8 %
Hematocrit: 39.4 % (ref 34.0–46.6)
Hemoglobin: 13 g/dL (ref 11.1–15.9)
Immature Grans (Abs): 0 10*3/uL (ref 0.0–0.1)
Immature Granulocytes: 0 %
Lymphocytes Absolute: 2.2 10*3/uL (ref 0.7–3.1)
Lymphs: 53 %
MCH: 30.7 pg (ref 26.6–33.0)
MCHC: 33 g/dL (ref 31.5–35.7)
MCV: 93 fL (ref 79–97)
Monocytes Absolute: 0.2 10*3/uL (ref 0.1–0.9)
Monocytes: 5 %
Neutrophils Absolute: 1.3 10*3/uL — ABNORMAL LOW (ref 1.4–7.0)
Neutrophils: 33 %
Platelets: 281 10*3/uL (ref 150–450)
RBC: 4.23 x10E6/uL (ref 3.77–5.28)
RDW: 12.3 % (ref 11.7–15.4)
WBC: 4.1 10*3/uL (ref 3.4–10.8)

## 2023-03-21 LAB — BMP8+EGFR
BUN/Creatinine Ratio: 17 (ref 12–28)
BUN: 14 mg/dL (ref 8–27)
CO2: 29 mmol/L (ref 20–29)
Calcium: 9.2 mg/dL (ref 8.7–10.3)
Chloride: 98 mmol/L (ref 96–106)
Creatinine, Ser: 0.84 mg/dL (ref 0.57–1.00)
Glucose: 90 mg/dL (ref 70–99)
Potassium: 4 mmol/L (ref 3.5–5.2)
Sodium: 141 mmol/L (ref 134–144)
eGFR: 79 mL/min/{1.73_m2} (ref >59)

## 2023-03-21 LAB — HEMOGLOBIN A1C
Est. average glucose Bld gHb Est-mCnc: 137 mg/dL
Hgb A1c MFr Bld: 6.4 % — ABNORMAL HIGH (ref 4.8–5.6)

## 2023-03-21 LAB — LIPID PANEL
Chol/HDL Ratio: 3 {ratio} (ref 0.0–4.4)
Cholesterol, Total: 166 mg/dL (ref 100–199)
HDL: 55 mg/dL (ref >39)
LDL Chol Calc (NIH): 91 mg/dL (ref 0–99)
Triglycerides: 113 mg/dL (ref 0–149)
VLDL Cholesterol Cal: 20 mg/dL (ref 5–40)

## 2023-03-21 LAB — TSH+FREE T4
Free T4: 1.4 ng/dL (ref 0.82–1.77)
TSH: 5.24 u[IU]/mL — ABNORMAL HIGH (ref 0.450–4.500)

## 2023-03-21 MED ORDER — LEVOTHYROXINE SODIUM 150 MCG PO TABS: 150.0000 ug | ORAL_TABLET | Freq: Every day | ORAL | 3 refills | Status: DC

## 2023-03-21 NOTE — Progress Notes (Signed)
 Please inform patient,  TSH levels still elevated,I have increased your dose synthroid  150 mcg once daily- Medication sent to pharmacy   Take it on an empty stomach: Take your Synthroid  first thing in the morning, at least 30-60 minutes before eating breakfast or drinking coffee. Food and certain drinks can affect how well your body absorbs the medication.  Use water only: Swallow the pill with a full glass of water to ensure it dissolves properly.  Be consistent with timing: Take Synthroid  at the same time every day to maintain steady hormone levels in your body.  Avoid other medications or supplements around the same time: Do not take calcium, iron, antacids, or multivitamins within 4 hours of Synthroid , as these can interfere with absorption.    Hemoglobin A1c 6.4, continue metformin  500 mg once daily  A Sample Day of Eating for Diabetes   Breakfast: Oatmeal with a handful of berries and a sprinkle of chia seeds, paired with a boiled egg.  Lunch: Grilled chicken breast on a bed of spinach and kale, topped with avocado, a drizzle of olive oil, and a slice of whole grain bread.  Snack: A handful of almonds or a small apple with peanut butter.  Dinner: Baked salmon with roasted broccoli and quinoa.  Snack (if needed): A piece of cheese or a small bowl of Greek yogurt.  Find an activity that you will enjoyandstart to be active at least 5 days a week for 30 minutes each day.

## 2023-03-21 NOTE — Addendum Note (Signed)
 Addended by: Rosanna Comment on: 03/21/2023 08:07 AM   Modules accepted: Orders

## 2023-03-29 ENCOUNTER — Ambulatory Visit (INDEPENDENT_AMBULATORY_CARE_PROVIDER_SITE_OTHER): Payer: BC Managed Care – PPO | Admitting: Internal Medicine

## 2023-03-29 ENCOUNTER — Encounter: Payer: Self-pay | Admitting: Internal Medicine

## 2023-03-29 VITALS — BP 138/74 | HR 85 | Ht 64.0 in | Wt 219.2 lb

## 2023-03-29 DIAGNOSIS — J4531 Mild persistent asthma with (acute) exacerbation: Secondary | ICD-10-CM | POA: Diagnosis not present

## 2023-03-29 DIAGNOSIS — J069 Acute upper respiratory infection, unspecified: Secondary | ICD-10-CM

## 2023-03-29 MED ORDER — METHYLPREDNISOLONE 4 MG PO TBPK: ORAL_TABLET | ORAL | 0 refills | Status: DC

## 2023-03-29 MED ORDER — PROMETHAZINE-DM 6.25-15 MG/5ML PO SYRP: 5.0000 mL | ORAL_SOLUTION | Freq: Four times a day (QID) | ORAL | 0 refills | Status: AC | PRN

## 2023-03-29 NOTE — Patient Instructions (Signed)
Please start taking Prednisone as prescribed.  Please take Promethazine-DM syrup as needed for cough.

## 2023-03-29 NOTE — Assessment & Plan Note (Signed)
Likely has asthma exacerbation in the setting of possible viral infection Check COVID, flu and RSV tests Prescribed Medrol Dosepak Continue Symbicort regularly and as needed albuterol Promethazine DM syrup as needed for cough Work note provided

## 2023-03-29 NOTE — Progress Notes (Signed)
Acute Office Visit  Subjective:    Patient ID: Hannah Potter, female    DOB: Dec 29, 1960, 63 y.o.   MRN: [id number]  Chief Complaint  Patient presents with   Headache    Pt reports sx of headache, has a cough since yesterday.     HPI Patient is in today for complaint of nasal congestion, sinus pressure related headache and dry cough with dyspnea since yesterday.  Denies any fever or chills.  She was returned from her workplace due to feeling sick.  She reports history of asthma, uses Symbicort and as needed albuterol.  Her O2 saturation was initially in 80s, but later improved to 93% on room air only.  Past Medical History:  Diagnosis Date   Asthma    Bronchitis    Diabetes mellitus without complication (HCC)    Hypertension    Sinus problem    Thyroid disease     Past Surgical History:  Procedure Laterality Date   ABDOMINAL HYSTERECTOMY     ABDOMINAL SURGERY     HERNIA REPAIR      Family History  Problem Relation Age of Onset   Diabetes Father    Hypertension Father     Social History   Socioeconomic History   Marital status: Single    Spouse name: Not on file   Number of children: Not on file   Years of education: Not on file   Highest education level: Not on file  Occupational History   Not on file  Tobacco Use   Smoking status: Never   Smokeless tobacco: Never  Substance and Sexual Activity   Alcohol use: No   Drug use: No   Sexual activity: Not on file  Other Topics Concern   Not on file  Social History Narrative   Not on file   Social Drivers of Health   Financial Resource Strain: Not on file  Food Insecurity: Not on file  Transportation Needs: Not on file  Physical Activity: Not on file  Stress: Not on file  Social Connections: Unknown (06/24/2021)   Received from [facility]   Social Network    Social Network: Not on file  Intimate Partner Violence: Unknown (05/17/2021)   Received from [facility], [facility]    HITS    Physically Hurt: Not on file    Insult or Talk Down To: Not on file    Threaten Physical Harm: Not on file    Scream or Curse: Not on file    Outpatient Medications Prior to Visit  Medication Sig Dispense Refill   albuterol (PROAIR HFA) 108 (90 Base) MCG/ACT inhaler Inhale 2 puffs into the lungs every 4 (four) hours as needed for shortness of breath. 8 g 6   aspirin 81 MG chewable tablet Chew 81 mg by mouth daily.     budesonide-formoterol (SYMBICORT) 160-4.5 MCG/ACT inhaler Inhale 2 puffs into the lungs 2 (two) times daily as needed (Shortness of Breath). 1 each 3   chlorthalidone (HYGROTON) 25 MG tablet Take 0.5 tablets (12.5 mg total) by mouth daily. 90 tablet 1   DULoxetine (CYMBALTA) 30 MG capsule Take 1 capsule (30 mg total) by mouth daily. 30 capsule 3   ibuprofen (ADVIL) 800 MG tablet Take 1 tablet (800 mg total) by mouth every 8 (eight) hours as needed. 30 tablet 0   levothyroxine (SYNTHROID) 150 MCG tablet Take 1 tablet (150 mcg total) by mouth daily. 90 tablet 3   loratadine (CLARITIN) 10 MG tablet  Take 1 tablet (10 mg total) by mouth daily. 30 tablet 3   metFORMIN (GLUCOPHAGE) 500 MG tablet Take 1 tablet (500 mg total) by mouth daily with breakfast. 90 tablet 3   No facility-administered medications prior to visit.    No Known Allergies  Review of Systems  Constitutional:  Positive for fatigue. Negative for chills and fever.  HENT:  Positive for congestion. Negative for sinus pressure, sinus pain and sore throat.   Eyes:  Negative for pain and discharge.  Respiratory:  Positive for cough and shortness of breath.   Cardiovascular:  Negative for chest pain and palpitations.  Gastrointestinal:  Negative for abdominal pain, diarrhea, nausea and vomiting.  Endocrine: Negative for polydipsia and polyuria.  Genitourinary:  Negative for dysuria and hematuria.  Musculoskeletal:  Negative for neck pain and neck stiffness.  Skin:  Negative for rash.  Neurological:   Positive for headaches. Negative for dizziness and weakness.  Psychiatric/Behavioral:  Negative for agitation and behavioral problems.        Objective:    Physical Exam Vitals reviewed.  Constitutional:      General: She is not in acute distress.    Appearance: She is obese. She is not diaphoretic.  HENT:     Head: Normocephalic and atraumatic.     Nose: Congestion present.     Mouth/Throat:     Mouth: Mucous membranes are moist.     Pharynx: No posterior oropharyngeal erythema.  Eyes:     General: No scleral icterus.    Extraocular Movements: Extraocular movements intact.  Cardiovascular:     Rate and Rhythm: Normal rate and regular rhythm.     Pulses: Normal pulses.     Heart sounds: No murmur heard. Pulmonary:     Breath sounds: Wheezing (Mild, diffuse) present. No rales.  Abdominal:     Palpations: Abdomen is soft.     Tenderness: There is no abdominal tenderness.  Musculoskeletal:     Cervical back: Neck supple. No tenderness.     Right lower leg: No edema.     Left lower leg: No edema.  Skin:    General: Skin is warm.     Findings: No rash.  Neurological:     General: No focal deficit present.     Mental Status: She is alert and oriented to person, place, and time.  Psychiatric:        Mood and Affect: Mood normal.        Behavior: Behavior normal.     BP 138/74 (BP Location: Left Arm)   Pulse 85   Ht 5\' 4"  (1.626 m)   Wt 219 lb 3.2 oz (99.4 kg)   LMP 02/12/1998   SpO2 (!) 88%   BMI 37.63 kg/m  Wt Readings from Last 3 Encounters:  03/29/23 219 lb 3.2 oz (99.4 kg)  03/20/23 217 lb 1.3 oz (98.5 kg)  11/13/22 228 lb (103.4 kg)        Assessment & Plan:   Problem List Items Addressed This Visit       Respiratory   Mild persistent asthma with acute exacerbation - Primary   Likely has asthma exacerbation in the setting of possible viral infection Check COVID, flu and RSV tests Prescribed Medrol Dosepak Continue Symbicort regularly and as needed  albuterol Promethazine DM syrup as needed for cough Work note provided       Relevant Medications   methylPREDNISolone (MEDROL DOSEPAK) 4 MG TBPK tablet   promethazine-dextromethorphan (PROMETHAZINE-DM) 6.25-15 MG/5ML syrup  Other Visit Diagnoses       URTI (acute upper respiratory infection)       Relevant Orders   COVID-19, Flu A+B and RSV        Meds ordered this encounter  Medications   methylPREDNISolone (MEDROL DOSEPAK) 4 MG TBPK tablet    Sig: Take as package instructions.    Dispense:  1 each    Refill:  0   promethazine-dextromethorphan (PROMETHAZINE-DM) 6.25-15 MG/5ML syrup    Sig: Take 5 mLs by mouth 4 (four) times daily as needed.    Dispense:  118 mL    Refill:  0     Noell Shular Concha Se, MD

## 2023-04-01 ENCOUNTER — Ambulatory Visit (INDEPENDENT_AMBULATORY_CARE_PROVIDER_SITE_OTHER): Payer: BC Managed Care – PPO

## 2023-04-01 DIAGNOSIS — Z794 Long term (current) use of insulin: Secondary | ICD-10-CM

## 2023-04-01 DIAGNOSIS — E119 Type 2 diabetes mellitus without complications: Secondary | ICD-10-CM | POA: Diagnosis not present

## 2023-04-01 LAB — HM DIABETES EYE EXAM

## 2023-04-01 NOTE — Progress Notes (Signed)
 Hannah Potter arrived 04/01/2023 and has given verbal consent to obtain images and complete their overdue diabetic retinal screening.  The images have been sent to an ophthalmologist or optometrist for review and interpretation.  Results will be sent back to Del Newman Nip, [place], FNP for review.  Patient has been informed they will be contacted when we receive the results via telephone or MyChart

## 2023-04-03 LAB — COVID-19, FLU A+B AND RSV
Influenza A, NAA: DETECTED — AB
Influenza B, NAA: NOT DETECTED
RSV, NAA: NOT DETECTED
SARS-CoV-2, NAA: NOT DETECTED

## 2023-05-22 ENCOUNTER — Encounter: Payer: Self-pay | Admitting: Family Medicine

## 2023-05-22 ENCOUNTER — Ambulatory Visit: Payer: BC Managed Care – PPO | Admitting: Family Medicine

## 2023-05-22 VITALS — BP 137/82 | HR 59 | Ht 64.0 in | Wt 217.1 lb

## 2023-05-22 DIAGNOSIS — M25511 Pain in right shoulder: Secondary | ICD-10-CM

## 2023-05-22 MED ORDER — CYCLOBENZAPRINE HCL 5 MG PO TABS: 5.0000 mg | ORAL_TABLET | Freq: Two times a day (BID) | ORAL | 2 refills | Status: DC | PRN

## 2023-05-22 MED ORDER — CHLORTHALIDONE 25 MG PO TABS: 12.5000 mg | ORAL_TABLET | Freq: Every day | ORAL | 1 refills | Status: DC

## 2023-05-22 MED ORDER — PREDNISONE 20 MG PO TABS: 20.0000 mg | ORAL_TABLET | Freq: Two times a day (BID) | ORAL | 0 refills | Status: AC

## 2023-05-22 NOTE — Assessment & Plan Note (Signed)
 XRAY ordered awaiting results will follow up Prednisone 20 mg twice daily x 5 days I explained to the patient that non-pharmacological interventions include the application of ice or heat, rest, and recommended range of motion exercises along with gentle stretching. For pain management, Tylenol was advised. The patient was instructed to follow up if symptoms worsen or persist. The patient verbalized understanding of the care plan, and all questions were answered.

## 2023-05-22 NOTE — Progress Notes (Signed)
 Established Patient Office Visit   Subjective  Patient ID: Hannah Potter, female    DOB: [birth year]  Age: 63 y.o. MRN: [id number]  Chief Complaint  Patient presents with   Medical Management of Chronic Issues    2 month Pap smear AND 6 month chronic follow up. Pain and mobility issues w/ rt. Shoulder x 2 mo    She  has a past medical history of Asthma, Bronchitis, Diabetes mellitus without complication (HCC), Hypertension, Sinus problem, and Thyroid disease.  Shoulder Pain  The patient reports chronic right shoulder pain, with the current episode beginning over a year ago. There is no history of trauma or injury to the extremity. The pain is constant and has progressively worsened over time. The quality of the pain is described as aching, pounding, sharp, and burning, with a severity rating of 9/10.The patient experiences additional symptoms, including an inability to bear weight on the affected arm, joint locking, swelling, limited range of motion, and stiffness. These symptoms are aggravated by physical activity. The patient has attempted to manage the pain with acetaminophen, but this has provided no relief.      Review of Systems  Constitutional:  Negative for chills and fever.  Eyes:  Negative for blurred vision.  Respiratory:  Negative for shortness of breath.   Cardiovascular:  Negative for chest pain.  Gastrointestinal:  Negative for abdominal pain.  Musculoskeletal:  Positive for myalgias.       RIGHT SHOULDER PAIN  Neurological:  Negative for dizziness and headaches.      Objective:     BP 137/82   Pulse (!) 59   Ht 5\' 4"  (1.626 m)   Wt 217 lb 1.9 oz (98.5 kg)   LMP 02/12/1998   SpO2 93%   BMI 37.27 kg/m  BP Readings from Last 3 Encounters:  05/22/23 137/82  03/29/23 138/74  03/20/23 122/70      Physical Exam Vitals reviewed.  Constitutional:      General: She is not in acute distress.    Appearance: Normal appearance. She is not ill-appearing,  toxic-appearing or diaphoretic.  HENT:     Head: Normocephalic.  Eyes:     General:        Right eye: No discharge.        Left eye: No discharge.     Conjunctiva/sclera: Conjunctivae normal.  Cardiovascular:     Rate and Rhythm: Normal rate.     Pulses: Normal pulses.     Heart sounds: Normal heart sounds.  Pulmonary:     Effort: Pulmonary effort is normal. No respiratory distress.     Breath sounds: Normal breath sounds.  Abdominal:     Tenderness: There is no left CVA tenderness.  Musculoskeletal:     Right shoulder: Tenderness present. No swelling. Decreased range of motion. Decreased strength.     Left shoulder: No swelling or tenderness. Normal range of motion. Decreased strength.  Skin:    General: Skin is warm and dry.     Capillary Refill: Capillary refill takes less than 2 seconds.  Neurological:     Mental Status: She is alert.  Psychiatric:        Mood and Affect: Mood normal.        Behavior: Behavior normal.      No results found for any visits on 05/22/23.  The 10-year ASCVD risk score (Arnett DK, et al., 2019) is: 17.6%    Assessment & Plan:  Acute pain of right shoulder Assessment &  Plan: XRAY ordered awaiting results will follow up Prednisone 20 mg twice daily x 5 days I explained to the patient that non-pharmacological interventions include the application of ice or heat, rest, and recommended range of motion exercises along with gentle stretching. For pain management, Tylenol was advised. The patient was instructed to follow up if symptoms worsen or persist. The patient verbalized understanding of the care plan, and all questions were answered.   Orders: -     DG Shoulder Right; Future -     predniSONE; Take 1 tablet (20 mg total) by mouth 2 (two) times daily with a meal for 5 days.  Dispense: 10 tablet; Refill: 0  Other orders -     Chlorthalidone; Take 0.5 tablets (12.5 mg total) by mouth daily.  Dispense: 90 tablet; Refill: 1 -      Cyclobenzaprine HCl; Take 1 tablet (5 mg total) by mouth 2 (two) times daily as needed.  Dispense: 60 tablet; Refill: 2    Return if symptoms worsen or fail to improve.   Cruzita Lederer Newman Nip, FNP

## 2023-05-22 NOTE — Patient Instructions (Signed)

## 2023-05-31 ENCOUNTER — Ambulatory Visit (HOSPITAL_COMMUNITY)
Admission: RE | Admit: 2023-05-31 | Discharge: 2023-05-31 | Disposition: A | Discharge status: Home / Self Care | Source: Ambulatory Visit | Attending: Family Medicine | Admitting: Family Medicine

## 2023-05-31 ENCOUNTER — Other Ambulatory Visit: Payer: Self-pay | Admitting: Family Medicine

## 2023-05-31 DIAGNOSIS — M25511 Pain in right shoulder: Secondary | ICD-10-CM | POA: Insufficient documentation

## 2023-05-31 DIAGNOSIS — G8929 Other chronic pain: Secondary | ICD-10-CM

## 2023-05-31 DIAGNOSIS — M19011 Primary osteoarthritis, right shoulder: Secondary | ICD-10-CM | POA: Diagnosis not present

## 2023-08-22 ENCOUNTER — Other Ambulatory Visit: Payer: Self-pay | Admitting: Family Medicine

## 2023-08-22 MED ORDER — METFORMIN HCL 500 MG PO TABS: 500.0000 mg | ORAL_TABLET | Freq: Every day | ORAL | 3 refills | Status: DC

## 2023-08-22 MED ORDER — IBUPROFEN 800 MG PO TABS: 800.0000 mg | ORAL_TABLET | Freq: Three times a day (TID) | ORAL | 0 refills | Status: AC | PRN

## 2023-08-22 NOTE — Telephone Encounter (Signed)
 Copied from CRM [id number]. Topic: Clinical - Medication Refill >> Aug 22, 2023  3:49 PM Montie POUR wrote: Medication: metFORMIN  (GLUCOPHAGE ) 500 MG tablet AND ibuprofen  (ADVIL ) 800 MG tablet Has the patient contacted their pharmacy? Yes (Agent: If no, request that the patient contact the pharmacy for the refill. If patient does not wish to contact the pharmacy document the reason why and proceed with request.) (Agent: If yes, when and what did the pharmacy advise?) Pharmacy needs order to refill  This is the patient's preferred pharmacy:  [facility] - [place], [place] - [address] [place] [address] Phone: [phone] Fax: [phone]  Is this the correct pharmacy for this prescription? Yes If no, delete pharmacy and type the correct one.   Has the prescription been filled recently? No  Is the patient out of the medication? Yes  Has the patient been seen for an appointment in the last year OR does the patient have an upcoming appointment? No  Can we respond through MyChart? Yes  Agent: Please be advised that Rx refills may take up to 3 business days. We ask that you follow-up with your pharmacy.

## 2023-08-23 ENCOUNTER — Telehealth: Payer: Self-pay

## 2023-08-23 NOTE — Telephone Encounter (Signed)
 Copied from CRM [id number]. Topic: Clinical - Medication Question >> Aug 22, 2023  3:53 PM Montie POUR wrote: Reason for CRM:  She needs her pain pills refilled and she cannot remember to name of medication. She is at work and does not have the bottom with her. Please send her a message through MyChart about refilling this medication. Thanks

## 2023-09-18 ENCOUNTER — Ambulatory Visit: Payer: Self-pay | Admitting: Family Medicine

## 2023-09-18 ENCOUNTER — Encounter: Payer: Self-pay | Admitting: Family Medicine

## 2023-09-18 VITALS — BP 124/74 | HR 56 | Ht 64.0 in | Wt 202.0 lb

## 2023-09-18 DIAGNOSIS — I10 Essential (primary) hypertension: Secondary | ICD-10-CM | POA: Diagnosis not present

## 2023-09-18 DIAGNOSIS — Z7984 Long term (current) use of oral hypoglycemic drugs: Secondary | ICD-10-CM

## 2023-09-18 DIAGNOSIS — E038 Other specified hypothyroidism: Secondary | ICD-10-CM

## 2023-09-18 DIAGNOSIS — E1169 Type 2 diabetes mellitus with other specified complication: Secondary | ICD-10-CM | POA: Diagnosis not present

## 2023-09-18 MED ORDER — MELOXICAM 7.5 MG PO TABS: 7.5000 mg | ORAL_TABLET | Freq: Every day | ORAL | 2 refills | Status: AC

## 2023-09-18 MED ORDER — DULOXETINE HCL 30 MG PO CPEP: 30.0000 mg | ORAL_CAPSULE | Freq: Every day | ORAL | 3 refills | Status: AC

## 2023-09-18 MED ORDER — BUDESONIDE-FORMOTEROL FUMARATE 160-4.5 MCG/ACT IN AERO: 2.0000 | INHALATION_SPRAY | Freq: Two times a day (BID) | RESPIRATORY_TRACT | 3 refills | Status: DC | PRN

## 2023-09-18 MED ORDER — CHLORTHALIDONE 25 MG PO TABS: 12.5000 mg | ORAL_TABLET | Freq: Every day | ORAL | 1 refills | Status: DC

## 2023-09-18 MED ORDER — ALBUTEROL SULFATE HFA 108 (90 BASE) MCG/ACT IN AERS: 2.0000 | INHALATION_SPRAY | RESPIRATORY_TRACT | 6 refills | Status: DC | PRN

## 2023-09-18 NOTE — Patient Instructions (Signed)

## 2023-09-18 NOTE — Progress Notes (Signed)
 Established Patient Office Visit   Subjective  Patient ID: Hannah Potter, female    DOB: 1960-11-15  Age: 63 y.o. MRN: [id number]  Chief Complaint  Patient presents with   Care Management    Two month follow up    She  has a past medical history of Asthma, Bronchitis, Diabetes mellitus without complication (HCC), Hypertension, Sinus problem, and Thyroid  disease.  HPI Patient presents to the clinic for chronic follow up. For the details of today's visit, please refer to assessment and plan.   Review of Systems  Constitutional:  Negative for chills and fever.  Eyes:  Negative for blurred vision.  Respiratory:  Negative for shortness of breath.   Cardiovascular:  Negative for chest pain.  Neurological:  Negative for dizziness and headaches.      Objective:     BP 124/74   Pulse (!) 56   Ht 5' 4 (1.626 m)   Wt 202 lb 0.6 oz (91.6 kg)   LMP 02/12/1998   SpO2 93%   BMI 34.68 kg/m  BP Readings from Last 3 Encounters:  09/18/23 124/74  05/22/23 137/82  03/29/23 138/74      Physical Exam Vitals reviewed.  Constitutional:      General: She is not in acute distress.    Appearance: Normal appearance. She is not ill-appearing, toxic-appearing or diaphoretic.  HENT:     Head: Normocephalic.  Eyes:     General:        Right eye: No discharge.        Left eye: No discharge.     Conjunctiva/sclera: Conjunctivae normal.  Cardiovascular:     Rate and Rhythm: Normal rate.     Pulses: Normal pulses.     Heart sounds: Normal heart sounds.  Pulmonary:     Effort: Pulmonary effort is normal. No respiratory distress.     Breath sounds: Normal breath sounds.  Musculoskeletal:     Cervical back: Normal range of motion.  Skin:    General: Skin is warm and dry.     Capillary Refill: Capillary refill takes less than 2 seconds.  Neurological:     Mental Status: She is alert.  Psychiatric:        Mood and Affect: Mood normal.        Behavior: Behavior normal.       No results found for any visits on 09/18/23.  The 10-year ASCVD risk score (Arnett DK, et al., 2019) is: 13.7%    Assessment & Plan:  Primary hypertension Assessment & Plan: Vitals:   09/18/23 1031  BP: 124/74   Continue chlorthalidone  12.5 mg daily,   Labs ordered. Discussed with  patient to monitor their blood pressure regularly and maintain a heart-healthy diet rich in fruits, vegetables, whole grains, and low-fat dairy, while reducing sodium intake to less than 2,300 mg per day. Regular physical activity, such as 30 minutes of moderate exercise most days of the week, will help lower blood pressure and improve overall cardiovascular health. Avoiding smoking, limiting alcohol consumption, and managing stress. Take  prescribed medication, & take it as directed and avoid skipping doses. Seek emergency care if your blood pressure is (over 180/100) or you experience chest pain, shortness of breath, or sudden vision changes.Patient verbalizes understanding regarding plan of care and all questions answered.  Orders: -     BMP8+eGFR -     CBC with Differential/Platelet  TSH (thyroid -stimulating hormone deficiency) -     TSH + free T4  Type 2 diabetes mellitus with other specified complication, without long-term current use of insulin (HCC) Assessment & Plan: Last Hemoglobin A1c: 6.4 Labs: Ordered today, results pending; will follow up accordingly. The patient reports adhering to prescribed medications: Metformin  500 mg once daily   Reviewed non-pharmacological interventions, including a balanced diet rich in lean proteins, healthy fats, whole grains, and high-fiber vegetables. Emphasized reducing refined sugars and processed carbohydrates, and incorporating more fruits, leafy greens, and legumes. Education: Patient was educated on recognizing signs and symptoms of both hypoglycemia and hyperglycemia, and advised to seek emergency care if these symptoms occur. Follow-Up: Scheduled  for follow-up in 3-4 months, or sooner if needed. Patient Understanding: The patient verbalized understanding of the care plan, and all questions were answered. Additional Care: Ophthalmology exam current Foot exam results were within normal limits.   Orders: -     Hemoglobin A1c -     Microalbumin / creatinine urine ratio  Other orders -     Meloxicam ; Take 1 tablet (7.5 mg total) by mouth daily. For pain  Dispense: 30 tablet; Refill: 2 -     DULoxetine  HCl; Take 1 capsule (30 mg total) by mouth daily.  Dispense: 30 capsule; Refill: 3 -     Chlorthalidone ; Take 0.5 tablets (12.5 mg total) by mouth daily.  Dispense: 90 tablet; Refill: 1 -     Budesonide -Formoterol  Fumarate; Inhale 2 puffs into the lungs 2 (two) times daily as needed (Shortness of Breath).  Dispense: 1 each; Refill: 3 -     Albuterol  Sulfate HFA; Inhale 2 puffs into the lungs every 4 (four) hours as needed for shortness of breath.  Dispense: 8 g; Refill: 6    Return in about 4 months (around 01/18/2024), or if symptoms worsen or fail to improve, for type 2 diabetes, hypertension.   Hannah Potter Hannah Falter, FNP

## 2023-09-18 NOTE — Assessment & Plan Note (Signed)
 Last Hemoglobin A1c: 6.4 Labs: Ordered today, results pending; will follow up accordingly. The patient reports adhering to prescribed medications: Metformin  500 mg once daily   Reviewed non-pharmacological interventions, including a balanced diet rich in lean proteins, healthy fats, whole grains, and high-fiber vegetables. Emphasized reducing refined sugars and processed carbohydrates, and incorporating more fruits, leafy greens, and legumes. Education: Patient was educated on recognizing signs and symptoms of both hypoglycemia and hyperglycemia, and advised to seek emergency care if these symptoms occur. Follow-Up: Scheduled for follow-up in 3-4 months, or sooner if needed. Patient Understanding: The patient verbalized understanding of the care plan, and all questions were answered. Additional Care: Ophthalmology exam current Foot exam results were within normal limits.

## 2023-09-18 NOTE — Assessment & Plan Note (Signed)
 Vitals:   09/18/23 1031  BP: 124/74   Continue chlorthalidone  12.5 mg daily,   Labs ordered. Discussed with  patient to monitor their blood pressure regularly and maintain a heart-healthy diet rich in fruits, vegetables, whole grains, and low-fat dairy, while reducing sodium intake to less than 2,300 mg per day. Regular physical activity, such as 30 minutes of moderate exercise most days of the week, will help lower blood pressure and improve overall cardiovascular health. Avoiding smoking, limiting alcohol consumption, and managing stress. Take  prescribed medication, & take it as directed and avoid skipping doses. Seek emergency care if your blood pressure is (over 180/100) or you experience chest pain, shortness of breath, or sudden vision changes.Patient verbalizes understanding regarding plan of care and all questions answered.

## 2023-09-19 ENCOUNTER — Other Ambulatory Visit: Payer: Self-pay | Admitting: Family Medicine

## 2023-09-19 ENCOUNTER — Ambulatory Visit: Payer: Self-pay | Admitting: Family Medicine

## 2023-09-19 LAB — BMP8+EGFR
BUN/Creatinine Ratio: 19 (ref 12–28)
BUN: 17 mg/dL (ref 8–27)
CO2: 26 mmol/L (ref 20–29)
Calcium: 9.7 mg/dL (ref 8.7–10.3)
Chloride: 99 mmol/L (ref 96–106)
Creatinine, Ser: 0.9 mg/dL (ref 0.57–1.00)
Glucose: 87 mg/dL (ref 70–99)
Potassium: 3.9 mmol/L (ref 3.5–5.2)
Sodium: 142 mmol/L (ref 134–144)
eGFR: 72 mL/min/1.73 (ref >59)

## 2023-09-19 LAB — HEMOGLOBIN A1C
Est. average glucose Bld gHb Est-mCnc: 128 mg/dL
Hgb A1c MFr Bld: 6.1 % — ABNORMAL HIGH (ref 4.8–5.6)

## 2023-09-19 LAB — TSH+FREE T4
Free T4: 1.33 ng/dL (ref 0.82–1.77)
TSH: 7.53 u[IU]/mL — ABNORMAL HIGH (ref 0.450–4.500)

## 2023-09-19 LAB — CBC WITH DIFFERENTIAL/PLATELET
Basophils Absolute: 0 x10E3/uL (ref 0.0–0.2)
Basos: 1 %
EOS (ABSOLUTE): 0.2 x10E3/uL (ref 0.0–0.4)
Eos: 3 %
Hematocrit: 41.6 % (ref 34.0–46.6)
Hemoglobin: 13.4 g/dL (ref 11.1–15.9)
Immature Grans (Abs): 0 x10E3/uL (ref 0.0–0.1)
Immature Granulocytes: 0 %
Lymphocytes Absolute: 2.5 x10E3/uL (ref 0.7–3.1)
Lymphs: 51 %
MCH: 30.1 pg (ref 26.6–33.0)
MCHC: 32.2 g/dL (ref 31.5–35.7)
MCV: 94 fL (ref 79–97)
Monocytes Absolute: 0.3 x10E3/uL (ref 0.1–0.9)
Monocytes: 6 %
Neutrophils Absolute: 1.9 x10E3/uL (ref 1.4–7.0)
Neutrophils: 39 %
Platelets: 278 x10E3/uL (ref 150–450)
RBC: 4.45 x10E6/uL (ref 3.77–5.28)
RDW: 12.8 % (ref 11.7–15.4)
WBC: 4.9 x10E3/uL (ref 3.4–10.8)

## 2023-09-19 MED ORDER — LEVOTHYROXINE SODIUM 175 MCG PO TABS: 175.0000 ug | ORAL_TABLET | Freq: Every day | ORAL | 1 refills | Status: AC

## 2023-09-19 NOTE — Progress Notes (Signed)
 Please inform the patient:  TSH level is elevated. I will adjust her Synthroid  dose to 175 mcg once daily. The updated prescription has been sent to the pharmacy.  Take it on an empty stomach: Take your Synthroid  first thing in the morning, at least 30-60 minutes before eating breakfast or drinking coffee. Food and certain drinks can affect how well your body absorbs the medication.  Use water only: Swallow the pill with a full glass of water to ensure it dissolves properly.  Be consistent with timing: Take Synthroid  at the same time every day to maintain steady hormone levels  Type 2 diabetes is well controlled with a Hemoglobin A1c of 6.1%. She should continue Metformin  500 mg once daily.  Kidney function is normal.

## 2023-09-20 LAB — MICROALBUMIN / CREATININE URINE RATIO
Creatinine, Urine: 86.4 mg/dL
Microalb/Creat Ratio: 13 mg/g{creat} (ref 0–29)
Microalbumin, Urine: 11.3 ug/mL

## 2023-11-17 DIAGNOSIS — L02512 Cutaneous abscess of left hand: Secondary | ICD-10-CM | POA: Diagnosis not present

## 2023-11-17 DIAGNOSIS — M79645 Pain in left finger(s): Secondary | ICD-10-CM | POA: Diagnosis not present

## 2023-11-18 DIAGNOSIS — M79645 Pain in left finger(s): Secondary | ICD-10-CM | POA: Diagnosis not present

## 2023-11-21 NOTE — Progress Notes (Signed)
 Hannah Potter                                          MRN: [id number]   11/21/2023   The [facility] Quality Team Specialist reviewed this patient medical record for the purposes of chart review for care gap closure. The following were reviewed: chart review for care gap closure-cervical cancer screening.    [facility] Quality Team

## 2024-03-18 ENCOUNTER — Telehealth: Payer: Self-pay | Admitting: Family Medicine

## 2024-03-18 ENCOUNTER — Other Ambulatory Visit: Payer: Self-pay

## 2024-03-18 MED ORDER — ALBUTEROL SULFATE HFA 108 (90 BASE) MCG/ACT IN AERS: 2.0000 | INHALATION_SPRAY | RESPIRATORY_TRACT | 6 refills | Status: AC | PRN

## 2024-03-18 MED ORDER — BUDESONIDE-FORMOTEROL FUMARATE 160-4.5 MCG/ACT IN AERO: 2.0000 | INHALATION_SPRAY | Freq: Two times a day (BID) | RESPIRATORY_TRACT | 3 refills | Status: AC | PRN

## 2024-03-18 MED ORDER — METFORMIN HCL 500 MG PO TABS: 500.0000 mg | ORAL_TABLET | Freq: Every day | ORAL | 3 refills | Status: AC

## 2024-03-18 MED ORDER — CHLORTHALIDONE 25 MG PO TABS: 12.5000 mg | ORAL_TABLET | Freq: Every day | ORAL | 1 refills | Status: AC

## 2024-03-18 NOTE — Telephone Encounter (Signed)
 Sent!

## 2024-03-18 NOTE — Telephone Encounter (Signed)
 Patient needing refills on  albuterol  (PROAIR  HFA) 108 (90 Base) MCG/ACT inhaler [[id number]]  budesonide -formoterol  (SYMBICORT ) 160-4.5 MCG/ACT inhaler [[id number]]  metFORMIN  (GLUCOPHAGE ) 500 MG tablet [[id number]]  She also said her blood pressure and pain medication Using [facility]. Thanks

## 2024-04-17 ENCOUNTER — Ambulatory Visit: Payer: Self-pay
